# Patient Record
Sex: Male | Born: 1935 | Race: Black or African American | Hispanic: No | Smoking: Former smoker
Health system: Southern US, Community
[De-identification: ages and names within clinical notes are randomized; demographics above are authoritative.]

## PROBLEM LIST (undated history)

## (undated) DIAGNOSIS — N189 Chronic kidney disease, unspecified: Secondary | ICD-10-CM

## (undated) DIAGNOSIS — E785 Hyperlipidemia, unspecified: Secondary | ICD-10-CM

## (undated) DIAGNOSIS — I10 Essential (primary) hypertension: Secondary | ICD-10-CM

## (undated) DIAGNOSIS — J449 Chronic obstructive pulmonary disease, unspecified: Secondary | ICD-10-CM

## (undated) HISTORY — PX: CHOLECYSTECTOMY: SHX55

---

## 2009-05-22 ENCOUNTER — Emergency Department (HOSPITAL_COMMUNITY): Admission: EM | Admit: 2009-05-22 | Discharge: 2009-05-22 | Payer: Self-pay | Admitting: Family Medicine

## 2009-11-29 ENCOUNTER — Inpatient Hospital Stay (HOSPITAL_COMMUNITY): Admission: EM | Admit: 2009-11-29 | Discharge: 2009-12-02 | Payer: Self-pay | Admitting: Emergency Medicine

## 2010-01-09 ENCOUNTER — Inpatient Hospital Stay (HOSPITAL_COMMUNITY): Admission: EM | Admit: 2010-01-09 | Discharge: 2010-01-11 | Payer: Self-pay | Admitting: Emergency Medicine

## 2010-01-17 ENCOUNTER — Observation Stay (HOSPITAL_COMMUNITY): Admission: EM | Admit: 2010-01-17 | Discharge: 2010-01-19 | Payer: Self-pay | Admitting: Emergency Medicine

## 2010-01-18 ENCOUNTER — Encounter: Payer: Self-pay | Admitting: Vascular Surgery

## 2010-01-18 ENCOUNTER — Ambulatory Visit: Payer: Self-pay | Admitting: Vascular Surgery

## 2010-02-20 ENCOUNTER — Ambulatory Visit: Payer: Self-pay | Admitting: Vascular Surgery

## 2010-07-08 IMAGING — CR DG KNEE COMPLETE 4+V*R*
4 series · 4 of 4 positions shown · non-contrast
Comparison: None.

CLINICAL DATA: Fall.

RIGHT KNEE - COMPLETE 4+ VIEW

[t knee ap right]
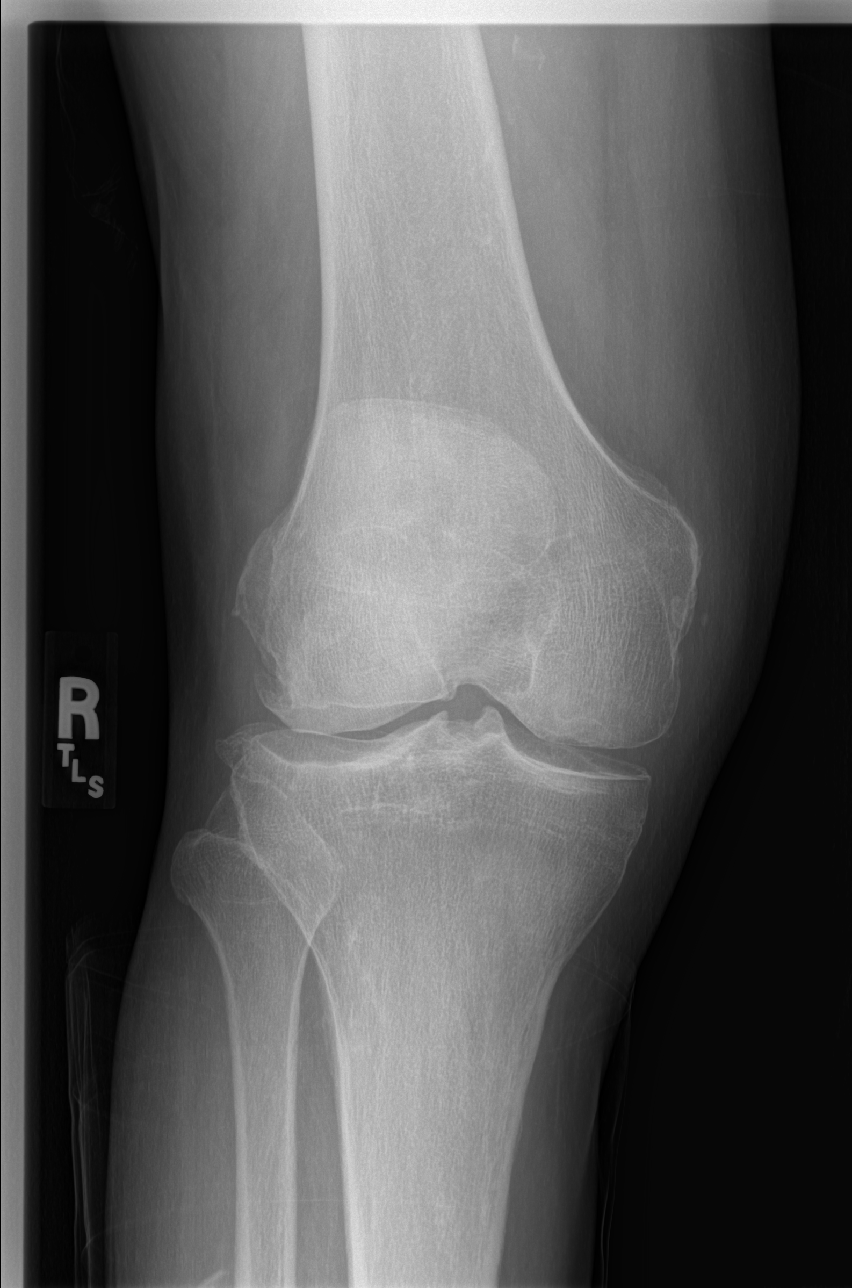

[t knee oblique right (1 of 2)]
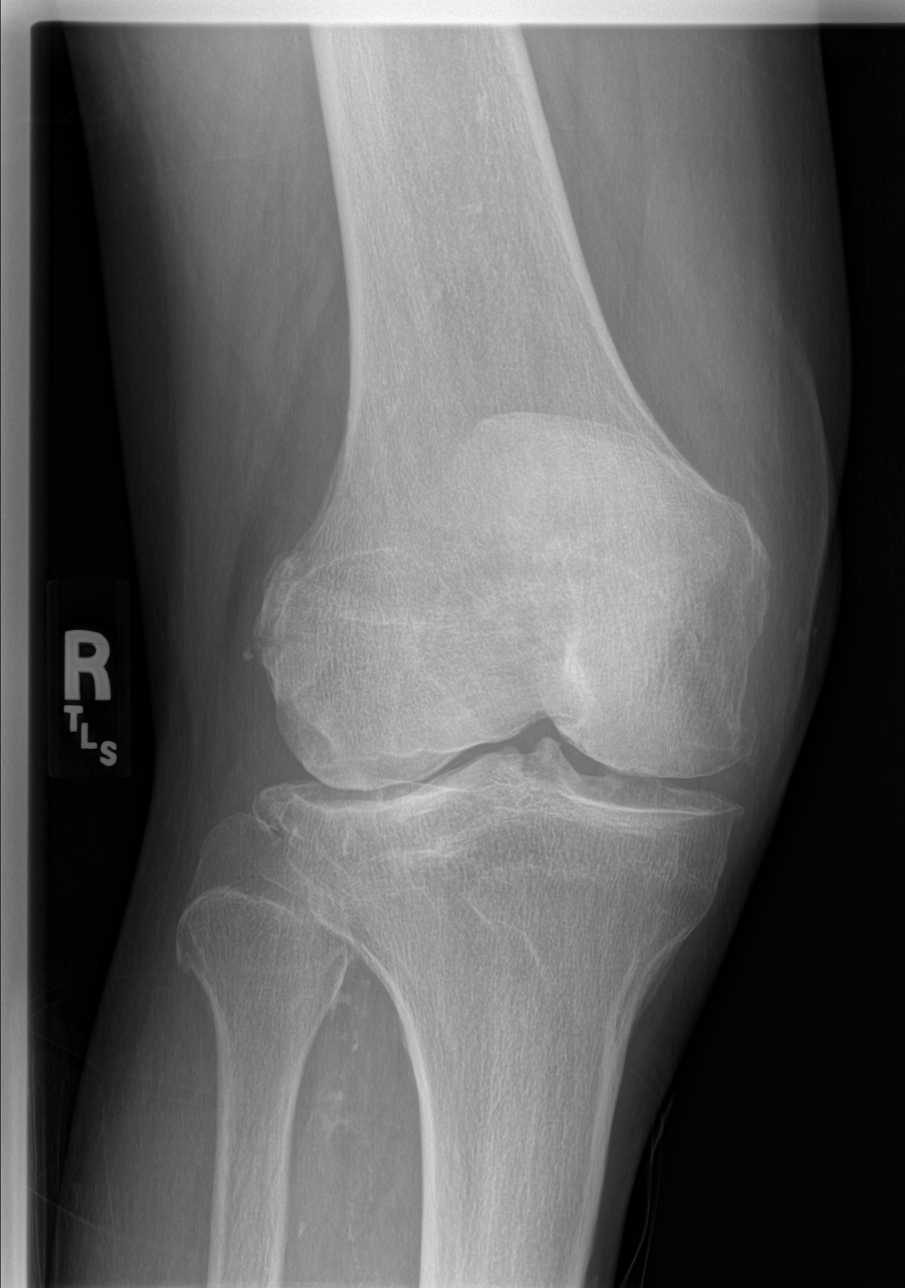

[t knee oblique right (2 of 2)]
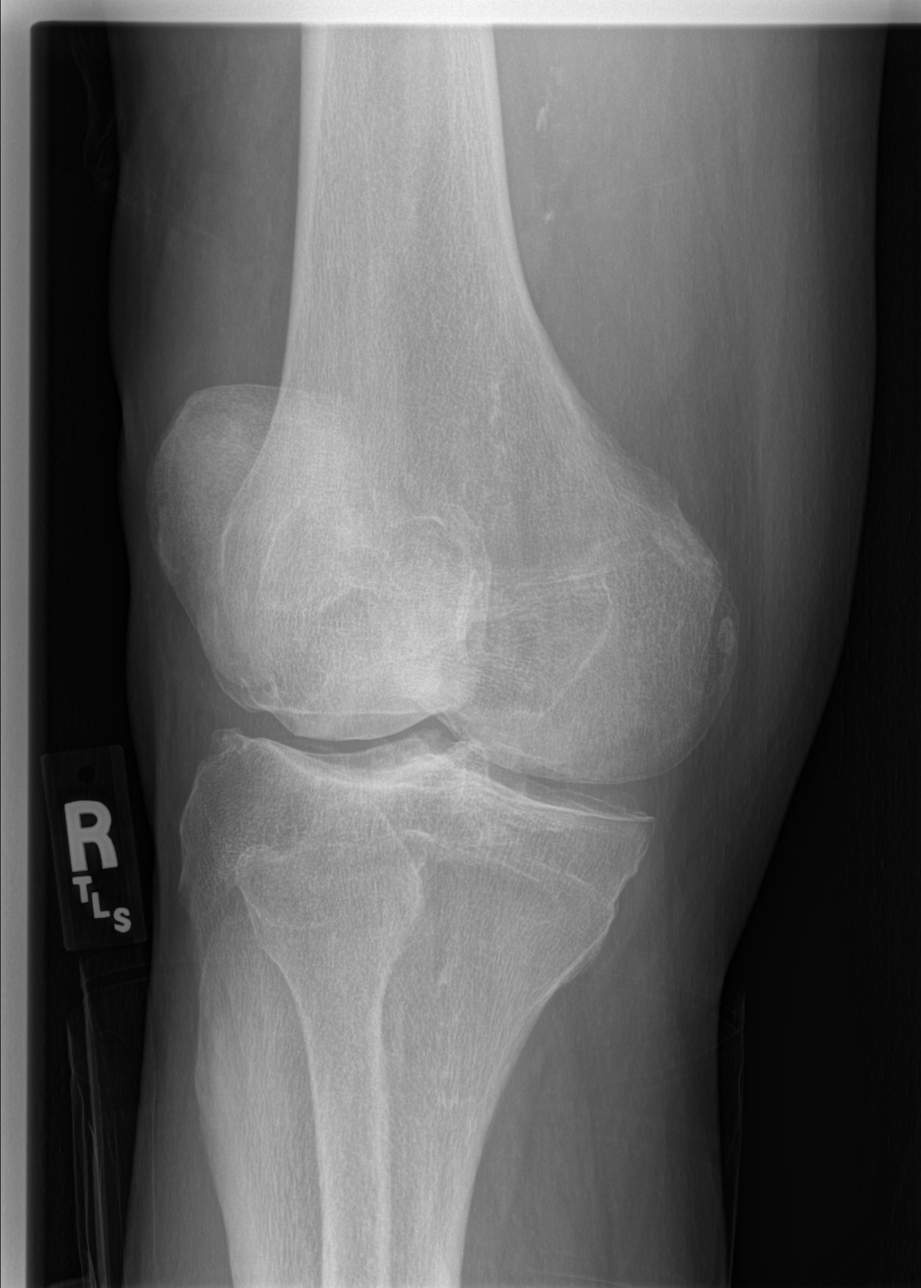

[t knee lat right]
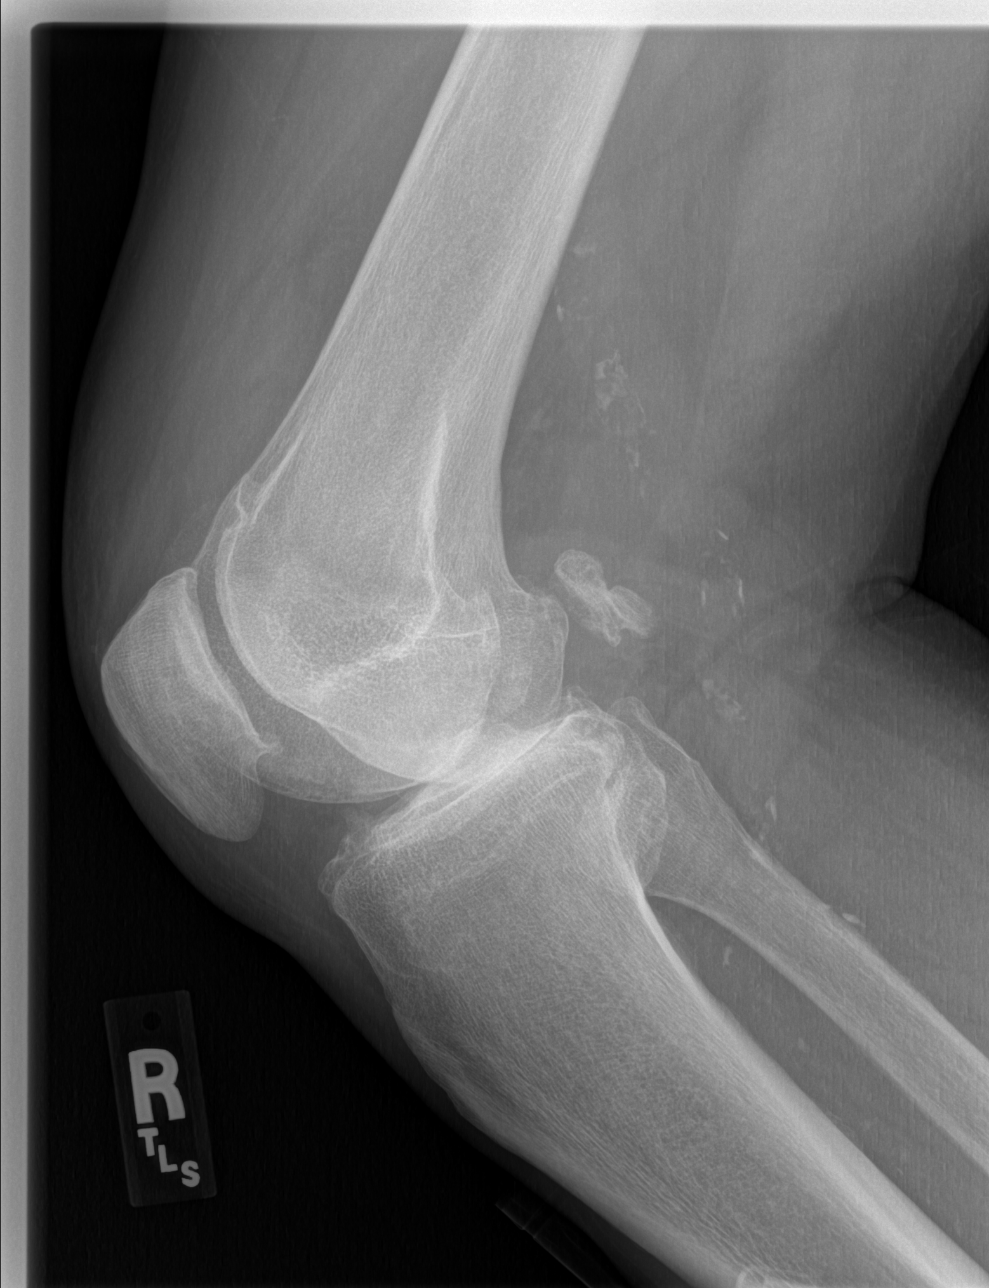

[4 of 4 positions shown; findings below may reference images not displayed]

FINDINGS: Negative for fracture.  There is degenerative change in
all three compartments, most severe laterally where there is joint
space narrowing and spurring.  There is a calcified loose body.  No
definite effusion.  There is popliteal artery calcification.
IMPRESSION: Negative for fracture.

Degenerative changes are most severe laterally.

## 2010-07-08 IMAGING — CR DG KNEE COMPLETE 4+V*L*
4 series · 4 of 4 positions shown · non-contrast
Comparison: None.

CLINICAL DATA: Fall.

LEFT KNEE - COMPLETE 4+ VIEW

[t knee ap left]
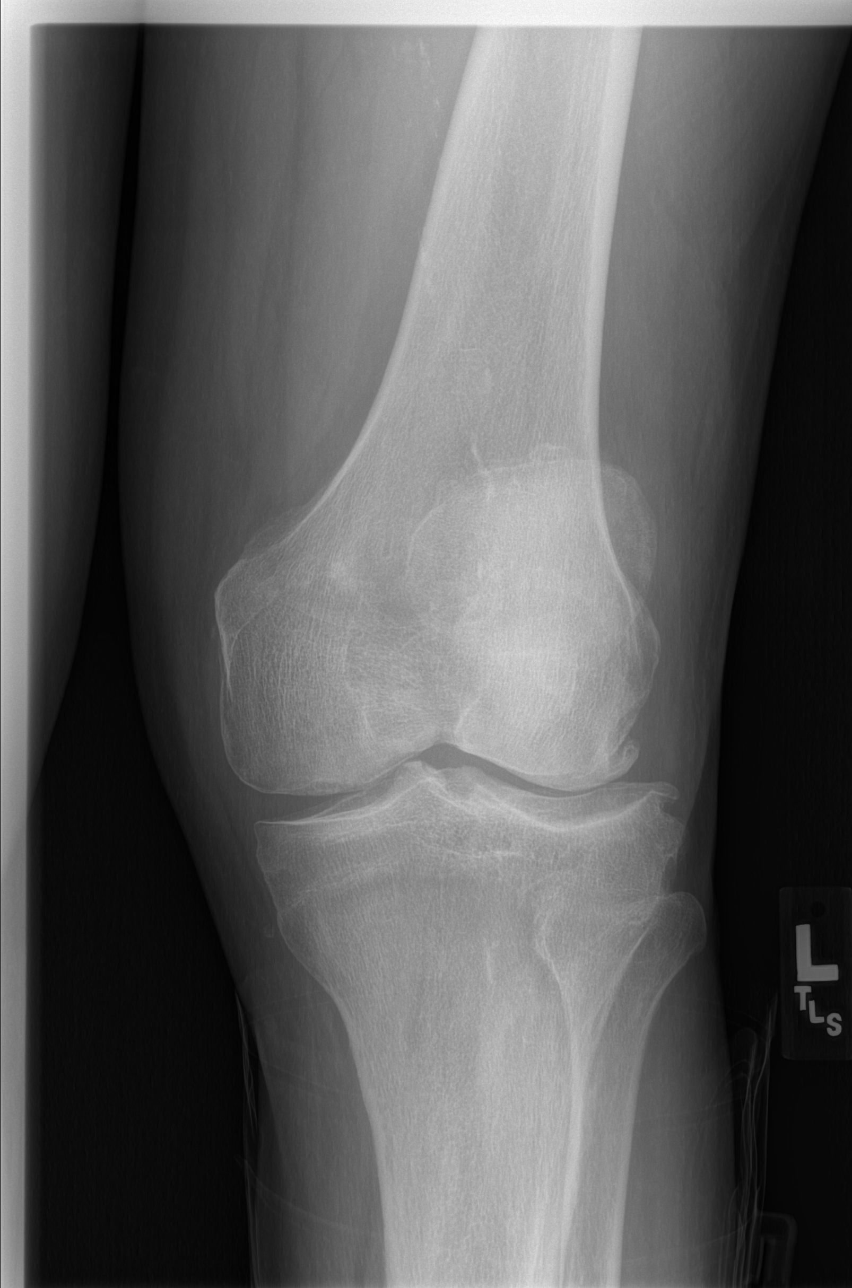

[t knee oblique left (1 of 2)]
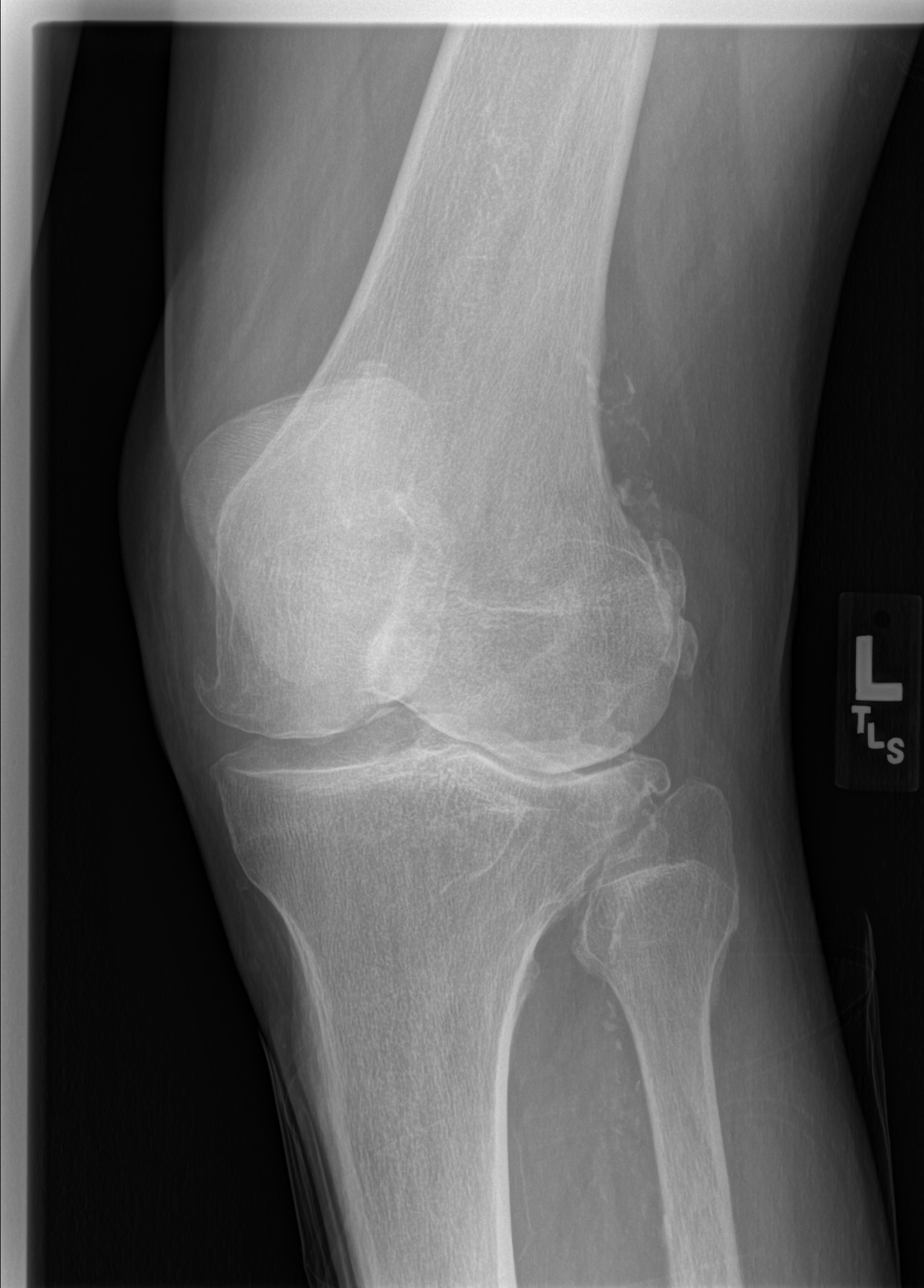

[t knee oblique left (2 of 2)]
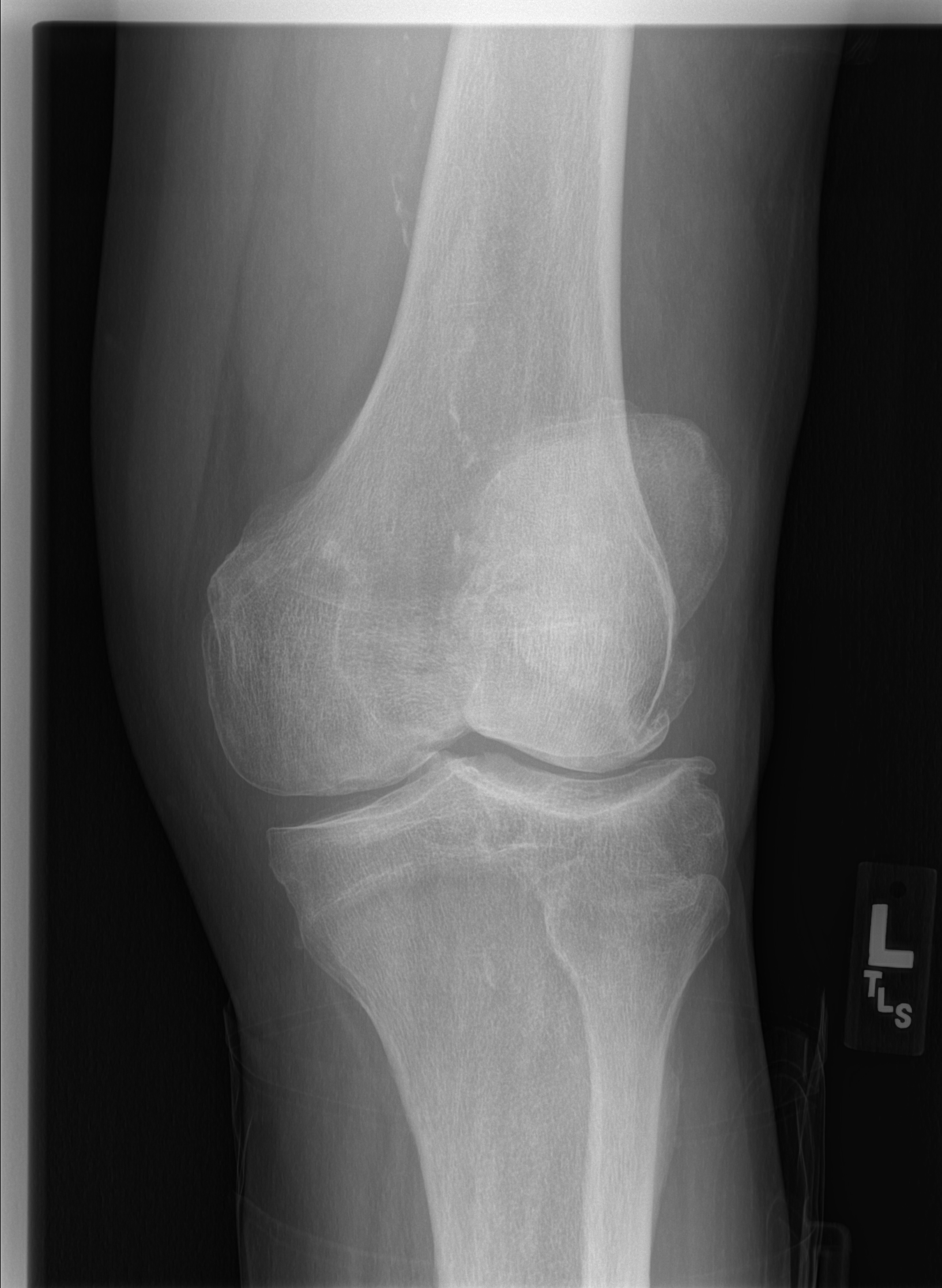

[t knee lat left]
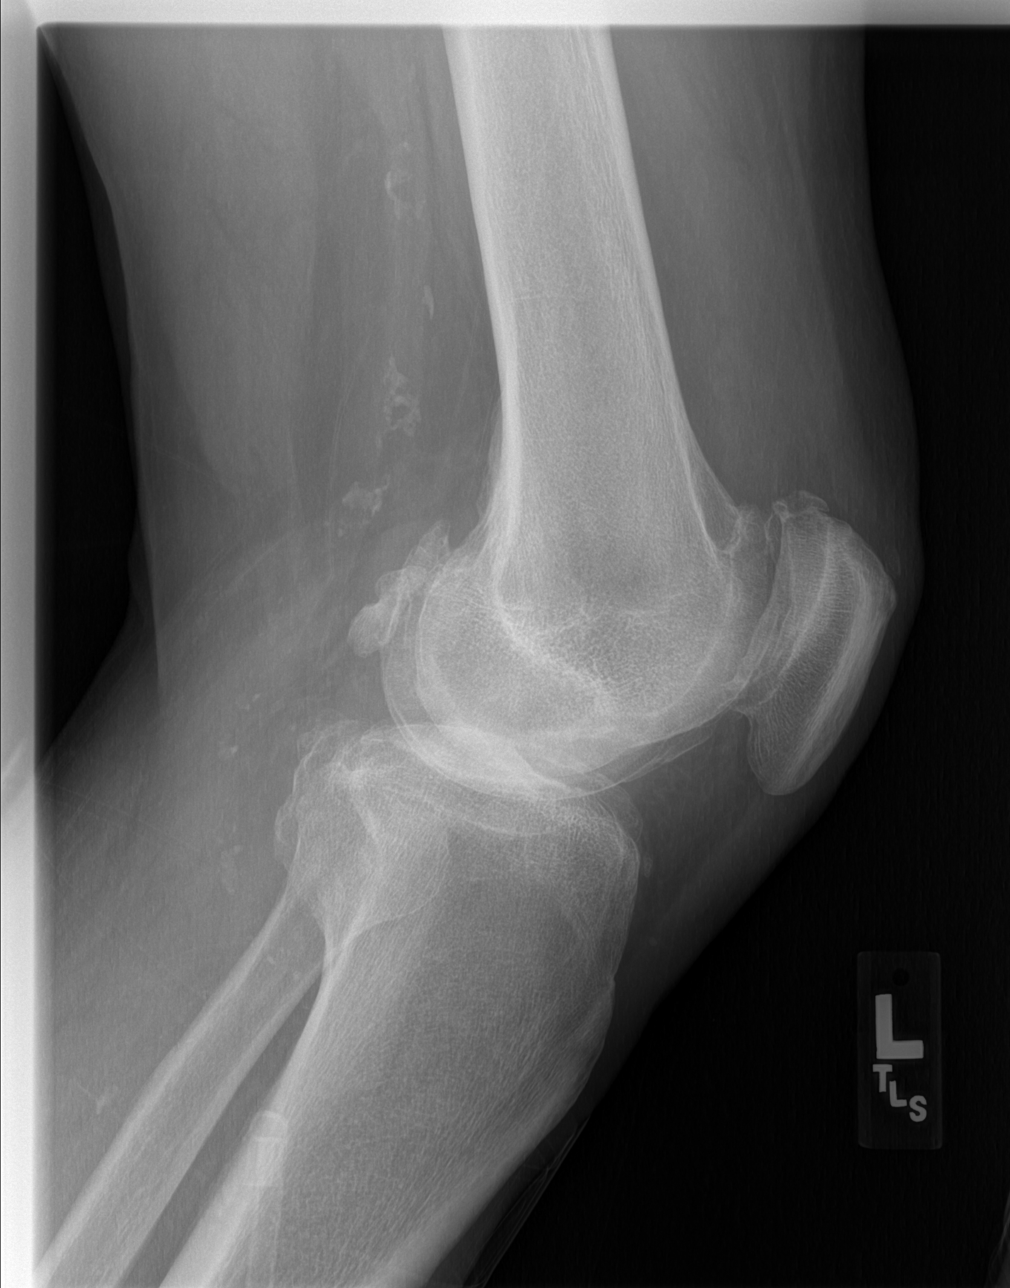

[4 of 4 positions shown; findings below may reference images not displayed]

FINDINGS: Negative for fracture.  There is degenerative change
which is severe in the lateral compartment and the patellofemoral
compartment.  Moderate degenerative change in the medial
compartment.  There is spurring which is most severe in the lateral
compartment.  There is a joint effusion.  There is a calcified
loose body posteriorly.  There is atherosclerotic calcification in
the popliteal artery.
IMPRESSION: Advanced degenerative changes.  Negative for fracture.

## 2010-09-29 ENCOUNTER — Emergency Department (HOSPITAL_COMMUNITY): Admission: EM | Admit: 2010-09-29 | Discharge: 2010-09-29 | Payer: Self-pay | Admitting: Emergency Medicine

## 2011-01-28 LAB — DIFFERENTIAL
Basophils Absolute: 0.1 10*3/uL (ref 0.0–0.1)
Basophils Relative: 1 % (ref 0–1)
Eosinophils Absolute: 0.8 10*3/uL — ABNORMAL HIGH (ref 0.0–0.7)
Eosinophils Relative: 14 % — ABNORMAL HIGH (ref 0–5)
Monocytes Absolute: 0.4 10*3/uL (ref 0.1–1.0)
Monocytes Relative: 8 % (ref 3–12)

## 2011-01-28 LAB — BASIC METABOLIC PANEL
BUN: 16 mg/dL (ref 6–23)
CO2: 29 mEq/L (ref 19–32)
GFR calc non Af Amer: 45 mL/min — ABNORMAL LOW (ref >60)
Glucose, Bld: 122 mg/dL — ABNORMAL HIGH (ref 70–99)
Potassium: 4.1 mEq/L (ref 3.5–5.1)
Sodium: 138 mEq/L (ref 135–145)

## 2011-01-28 LAB — CBC
HCT: 45.9 % (ref 39.0–52.0)
Hemoglobin: 14.9 g/dL (ref 13.0–17.0)
MCH: 29.6 pg (ref 26.0–34.0)
MCHC: 32.5 g/dL (ref 30.0–36.0)
RDW: 13.8 % (ref 11.5–15.5)

## 2011-02-02 LAB — URINALYSIS, ROUTINE W REFLEX MICROSCOPIC
Bilirubin Urine: NEGATIVE
Glucose, UA: 100 mg/dL — AB
Ketones, ur: NEGATIVE mg/dL
Protein, ur: 30 mg/dL — AB
Urobilinogen, UA: 0.2 mg/dL (ref 0.0–1.0)

## 2011-02-02 LAB — BASIC METABOLIC PANEL
BUN: 18 mg/dL (ref 6–23)
BUN: 11 mg/dL (ref 6–23)
CO2: 24 mEq/L (ref 19–32)
Chloride: 106 mEq/L (ref 96–112)
Chloride: 110 mEq/L (ref 96–112)
GFR calc non Af Amer: 38 mL/min — ABNORMAL LOW (ref >60)
GFR calc non Af Amer: 47 mL/min — ABNORMAL LOW (ref >60)
Glucose, Bld: 96 mg/dL (ref 70–99)
Potassium: 4.1 mEq/L (ref 3.5–5.1)
Potassium: 4 mEq/L (ref 3.5–5.1)
Sodium: 138 mEq/L (ref 135–145)
Sodium: 139 mEq/L (ref 135–145)

## 2011-02-02 LAB — DIFFERENTIAL
Basophils Absolute: 0.1 10*3/uL (ref 0.0–0.1)
Basophils Relative: 1 % (ref 0–1)
Eosinophils Absolute: 0.6 10*3/uL (ref 0.0–0.7)
Lymphocytes Relative: 27 % (ref 12–46)
Neutrophils Relative %: 57 % (ref 43–77)

## 2011-02-02 LAB — RENAL FUNCTION PANEL
Calcium: 8.5 mg/dL (ref 8.4–10.5)
Creatinine, Ser: 3.14 mg/dL — ABNORMAL HIGH (ref 0.4–1.5)
GFR calc Af Amer: 23 mL/min — ABNORMAL LOW (ref >60)
Glucose, Bld: 84 mg/dL (ref 70–99)
Phosphorus: 5.3 mg/dL — ABNORMAL HIGH (ref 2.3–4.6)
Sodium: 140 mEq/L (ref 135–145)

## 2011-02-02 LAB — URINE MICROSCOPIC-ADD ON

## 2011-02-02 LAB — CARDIAC PANEL(CRET KIN+CKTOT+MB+TROPI)
CK, MB: 6.4 ng/mL (ref 0.3–4.0)
Relative Index: 1.4 (ref 0.0–2.5)

## 2011-02-02 LAB — COMPREHENSIVE METABOLIC PANEL
Alkaline Phosphatase: 43 U/L (ref 39–117)
BUN: 34 mg/dL — ABNORMAL HIGH (ref 6–23)
Creatinine, Ser: 3.83 mg/dL — ABNORMAL HIGH (ref 0.4–1.5)
Glucose, Bld: 98 mg/dL (ref 70–99)
Potassium: 4.5 mEq/L (ref 3.5–5.1)
Total Bilirubin: 1 mg/dL (ref 0.3–1.2)
Total Protein: 7.5 g/dL (ref 6.0–8.3)

## 2011-02-02 LAB — URINE CULTURE

## 2011-02-02 LAB — CBC
HCT: 36.8 % — ABNORMAL LOW (ref 39.0–52.0)
HCT: 40.7 % (ref 39.0–52.0)
HCT: 35.5 % — ABNORMAL LOW (ref 39.0–52.0)
Hemoglobin: 12.4 g/dL — ABNORMAL LOW (ref 13.0–17.0)
Hemoglobin: 12.6 g/dL — ABNORMAL LOW (ref 13.0–17.0)
Hemoglobin: 13.9 g/dL (ref 13.0–17.0)
Hemoglobin: 12.3 g/dL — ABNORMAL LOW (ref 13.0–17.0)
MCHC: 34 g/dL (ref 30.0–36.0)
MCV: 92.1 fL (ref 78.0–100.0)
MCV: 92.3 fL (ref 78.0–100.0)
MCV: 92.5 fL (ref 78.0–100.0)
MCV: 90.5 fL (ref 78.0–100.0)
Platelets: 147 10*3/uL — ABNORMAL LOW (ref 150–400)
Platelets: 136 10*3/uL — ABNORMAL LOW (ref 150–400)
RBC: 3.98 MIL/uL — ABNORMAL LOW (ref 4.22–5.81)
RDW: 14.5 % (ref 11.5–15.5)
RDW: 14.6 % (ref 11.5–15.5)
RDW: 13.9 % (ref 11.5–15.5)
WBC: 6 10*3/uL (ref 4.0–10.5)

## 2011-02-02 LAB — TROPONIN I: Troponin I: 0.01 ng/mL (ref 0.00–0.06)

## 2011-02-02 LAB — LIPASE, BLOOD: Lipase: 38 U/L (ref 11–59)

## 2011-02-02 LAB — HEMOCCULT GUIAC POC 1CARD (OFFICE)
Fecal Occult Bld: NEGATIVE
Fecal Occult Bld: NEGATIVE

## 2011-02-02 LAB — PHOSPHORUS: Phosphorus: 5.5 mg/dL — ABNORMAL HIGH (ref 2.3–4.6)

## 2011-02-02 LAB — LACTIC ACID, PLASMA: Lactic Acid, Venous: 1.7 mmol/L (ref 0.5–2.2)

## 2011-02-02 LAB — HEMOGLOBIN A1C
Hgb A1c MFr Bld: 7.4 % — ABNORMAL HIGH (ref 4.6–6.1)
Mean Plasma Glucose: 166 mg/dL

## 2011-02-02 LAB — CK TOTAL AND CKMB (NOT AT ARMC): Relative Index: 1.6 (ref 0.0–2.5)

## 2011-02-05 LAB — COMPREHENSIVE METABOLIC PANEL
Albumin: 3.5 g/dL (ref 3.5–5.2)
Albumin: 3.6 g/dL (ref 3.5–5.2)
Alkaline Phosphatase: 50 U/L (ref 39–117)
BUN: 11 mg/dL (ref 6–23)
BUN: 13 mg/dL (ref 6–23)
BUN: 15 mg/dL (ref 6–23)
Calcium: 9.7 mg/dL (ref 8.4–10.5)
Chloride: 102 mEq/L (ref 96–112)
Creatinine, Ser: 1.2 mg/dL (ref 0.4–1.5)
Creatinine, Ser: 1.34 mg/dL (ref 0.4–1.5)
Creatinine, Ser: 1.36 mg/dL (ref 0.4–1.5)
GFR calc Af Amer: >60 mL/min (ref >60)
GFR calc non Af Amer: 58 mL/min — ABNORMAL LOW (ref >60)
Glucose, Bld: 127 mg/dL — ABNORMAL HIGH (ref 70–99)
Glucose, Bld: 86 mg/dL (ref 70–99)
Potassium: 3.8 mEq/L (ref 3.5–5.1)
Potassium: 4 mEq/L (ref 3.5–5.1)
Total Bilirubin: 1 mg/dL (ref 0.3–1.2)
Total Protein: 6.7 g/dL (ref 6.0–8.3)
Total Protein: 7.1 g/dL (ref 6.0–8.3)
Total Protein: 8.2 g/dL (ref 6.0–8.3)

## 2011-02-05 LAB — CBC
HCT: 36.1 % — ABNORMAL LOW (ref 39.0–52.0)
HCT: 37.3 % — ABNORMAL LOW (ref 39.0–52.0)
HCT: 39.8 % (ref 39.0–52.0)
HCT: 41.4 % (ref 39.0–52.0)
Hemoglobin: 14.4 g/dL (ref 13.0–17.0)
MCV: 83.8 fL (ref 78.0–100.0)
MCV: 91.6 fL (ref 78.0–100.0)
MCV: 92 fL (ref 78.0–100.0)
Platelets: 147 10*3/uL — ABNORMAL LOW (ref 150–400)
Platelets: 149 10*3/uL — ABNORMAL LOW (ref 150–400)
Platelets: 150 10*3/uL (ref 150–400)
RBC: 4.94 MIL/uL (ref 4.22–5.81)
RDW: 14.4 % (ref 11.5–15.5)
RDW: 14.4 % (ref 11.5–15.5)
RDW: 14.6 % (ref 11.5–15.5)
WBC: 6 10*3/uL (ref 4.0–10.5)
WBC: 6.2 10*3/uL (ref 4.0–10.5)

## 2011-02-05 LAB — DIFFERENTIAL
Basophils Absolute: 0 10*3/uL (ref 0.0–0.1)
Basophils Absolute: 0.1 10*3/uL (ref 0.0–0.1)
Eosinophils Absolute: 0.6 10*3/uL (ref 0.0–0.7)
Eosinophils Relative: 11 % — ABNORMAL HIGH (ref 0–5)
Lymphocytes Relative: 35 % (ref 12–46)
Lymphocytes Relative: 37 % (ref 12–46)
Lymphocytes Relative: 40 % (ref 12–46)
Lymphs Abs: 2.3 10*3/uL (ref 0.7–4.0)
Monocytes Absolute: 0.5 10*3/uL (ref 0.1–1.0)
Monocytes Absolute: 0.5 10*3/uL (ref 0.1–1.0)
Monocytes Relative: 9 % (ref 3–12)
Neutro Abs: 2.3 10*3/uL (ref 1.7–7.7)
Neutro Abs: 2.5 10*3/uL (ref 1.7–7.7)
Neutrophils Relative %: 44 % (ref 43–77)
Neutrophils Relative %: 44 % (ref 43–77)

## 2011-02-05 LAB — AMYLASE: Amylase: 43 U/L (ref 0–105)

## 2011-02-05 LAB — PROTIME-INR
INR: 1.08 (ref 0.00–1.49)
Prothrombin Time: 13.9 seconds (ref 11.6–15.2)

## 2011-02-05 LAB — BASIC METABOLIC PANEL
BUN: 17 mg/dL (ref 6–23)
Creatinine, Ser: 1.35 mg/dL (ref 0.4–1.5)
GFR calc non Af Amer: 51 mL/min — ABNORMAL LOW (ref >60)
Glucose, Bld: 118 mg/dL — ABNORMAL HIGH (ref 70–99)
Potassium: 4 mEq/L (ref 3.5–5.1)

## 2011-02-05 LAB — LIPASE, BLOOD: Lipase: 34 U/L (ref 11–59)

## 2011-02-05 LAB — HEMOCCULT GUIAC POC 1CARD (OFFICE): Fecal Occult Bld: NEGATIVE

## 2011-02-05 LAB — PHOSPHORUS: Phosphorus: 3.8 mg/dL (ref 2.3–4.6)

## 2011-02-10 LAB — COMPREHENSIVE METABOLIC PANEL
ALT: 14 U/L (ref 0–53)
AST: 21 U/L (ref 0–37)
Albumin: 3.5 g/dL (ref 3.5–5.2)
Albumin: 3.7 g/dL (ref 3.5–5.2)
Alkaline Phosphatase: 57 U/L (ref 39–117)
BUN: 16 mg/dL (ref 6–23)
BUN: 19 mg/dL (ref 6–23)
CO2: 25 mEq/L (ref 19–32)
CO2: 28 mEq/L (ref 19–32)
Chloride: 96 mEq/L (ref 96–112)
Chloride: 99 mEq/L (ref 96–112)
Creatinine, Ser: 1.43 mg/dL (ref 0.4–1.5)
Creatinine, Ser: 1.54 mg/dL — ABNORMAL HIGH (ref 0.4–1.5)
GFR calc non Af Amer: 47 mL/min — ABNORMAL LOW (ref >60)
Glucose, Bld: 133 mg/dL — ABNORMAL HIGH (ref 70–99)
Total Bilirubin: 0.6 mg/dL (ref 0.3–1.2)
Total Protein: 8 g/dL (ref 6.0–8.3)

## 2011-02-10 LAB — CBC
HCT: 38 % — ABNORMAL LOW (ref 39.0–52.0)
HCT: 39 % (ref 39.0–52.0)
Hemoglobin: 13.2 g/dL (ref 13.0–17.0)
MCHC: 33.8 g/dL (ref 30.0–36.0)
MCV: 90.9 fL (ref 78.0–100.0)
MCV: 92 fL (ref 78.0–100.0)
Platelets: 149 10*3/uL — ABNORMAL LOW (ref 150–400)
RBC: 4.28 MIL/uL (ref 4.22–5.81)
WBC: 7.1 10*3/uL (ref 4.0–10.5)
WBC: 7.7 10*3/uL (ref 4.0–10.5)

## 2011-02-10 LAB — URINALYSIS, ROUTINE W REFLEX MICROSCOPIC
Glucose, UA: NEGATIVE mg/dL
Ketones, ur: NEGATIVE mg/dL
Nitrite: NEGATIVE
Protein, ur: NEGATIVE mg/dL
Urobilinogen, UA: 0.2 mg/dL (ref 0.0–1.0)

## 2011-02-10 LAB — DIFFERENTIAL
Basophils Relative: 0 % (ref 0–1)
Eosinophils Absolute: 0.4 10*3/uL (ref 0.0–0.7)
Monocytes Absolute: 0.5 10*3/uL (ref 0.1–1.0)
Monocytes Relative: 6 % (ref 3–12)
Neutro Abs: 5.1 10*3/uL (ref 1.7–7.7)

## 2011-02-10 LAB — LIPID PANEL
Cholesterol: 165 mg/dL (ref 0–200)
LDL Cholesterol: 81 mg/dL (ref 0–99)
Triglycerides: 137 mg/dL (ref <150)

## 2011-02-10 LAB — URINE CULTURE

## 2011-04-01 NOTE — Assessment & Plan Note (Signed)
OFFICE VISIT   Austin Krause, Austin Krause  DOB:  17-Oct-1925                                       02/20/2010  CHART#:[id number]   The patient is an 77 year old male who was recently seen in consultation  in early March for abdominal pain.  At that time he was found to have an  infrarenal abdominal aortic occlusion.  It was discussed with the  primary service at that time that his pain was not secondary to his  aortic occlusion.  He did not really describe any claudication symptoms  at that time.  He was sent for an outpatient evaluation and returns  today for further followup.   He returns today for further follow-up.  He continues to deny any  symptoms of claudication.  He lives in an assisted living, and he states  that he fatigues easily when walking down the hallways but does not  really describe any pain in his calf.  He denies any abdominal pain at  this point.  He has never had any ulcerations or nonhealing wounds on  his feet.  He denies rest pain.  Chronic medical problems include  hypertension, elevated cholesterol and COPD.  These are all followed by  Dr. Concepcion Elk.   REVIEW OF SYSTEMS:  She has occasional wheezing.  He also has multiple  arthritis joint and muscle pain.   PHYSICAL EXAMINATION:  Blood pressure 149/77 in the left arm, heart rate  111 and regular, respirations 24.  HEENT:  Unremarkable.  Neck:  Has 2+  carotid pulses without bruit.  Chest:  Clear to auscultation, other than  a few crackles in the bases.  Cardiac:  Regular rate and rhythm without  murmur.  Abdomen:  Obese, nontender, nondistended.  No masses.  Extremities:  He has 2+ radial pulses bilaterally.  He has absent  femoral, popliteal and pedal pulses bilaterally.  Feet are warm with no  ulcerations.   He had bilateral ABIs performed today which were 0.69 on the right and  0.62 on the left.  CT scan the abdomen and pelvis as mentioned  previously showed infrarenal abdominal aortic  occlusion.   In summary, the patient has an infrarenal aortic occlusion.  He does not  really have claudication symptoms from this.  His ABIs indicate that he  has reasonable perfusion to both lower extremities.  He is not a really  good revascularization candidate due to his overall multiple medical  problems.  Since he currently does not have limb-threatening ischemia, I  believe the best management for this currently is going to be continued  observation with interval ABIs.  He will return in 6 months' time for  repeat ABIs.  He was also counseled today to stop smoking to prevent  worsening of his lower extremity symptoms over time.     Janetta Hora. Fields, MD  Electronically Signed   CEF/MEDQ  D:  02/20/2010  T:  02/21/2010  Job:  [id number]   cc:   Fleet Contras, M.D.

## 2012-01-11 ENCOUNTER — Emergency Department (HOSPITAL_COMMUNITY): Payer: Medicare (Managed Care)

## 2012-01-11 ENCOUNTER — Other Ambulatory Visit: Payer: Self-pay

## 2012-01-11 ENCOUNTER — Inpatient Hospital Stay (HOSPITAL_COMMUNITY)
Admission: EM | Admit: 2012-01-11 | Discharge: 2012-01-16 | DRG: 871 | Disposition: A | Discharge status: Home / Self Care | Payer: Medicare (Managed Care) | Attending: Internal Medicine | Admitting: Internal Medicine

## 2012-01-11 DIAGNOSIS — R7881 Bacteremia: Secondary | ICD-10-CM | POA: Diagnosis present

## 2012-01-11 DIAGNOSIS — J449 Chronic obstructive pulmonary disease, unspecified: Secondary | ICD-10-CM | POA: Diagnosis present

## 2012-01-11 DIAGNOSIS — I959 Hypotension, unspecified: Secondary | ICD-10-CM | POA: Diagnosis present

## 2012-01-11 DIAGNOSIS — N476 Balanoposthitis: Secondary | ICD-10-CM | POA: Diagnosis not present

## 2012-01-11 DIAGNOSIS — I129 Hypertensive chronic kidney disease with stage 1 through stage 4 chronic kidney disease, or unspecified chronic kidney disease: Secondary | ICD-10-CM | POA: Diagnosis present

## 2012-01-11 DIAGNOSIS — R6510 Systemic inflammatory response syndrome (SIRS) of non-infectious origin without acute organ dysfunction: Secondary | ICD-10-CM | POA: Diagnosis present

## 2012-01-11 DIAGNOSIS — R4182 Altered mental status, unspecified: Secondary | ICD-10-CM

## 2012-01-11 DIAGNOSIS — R000 Tachycardia, unspecified: Secondary | ICD-10-CM | POA: Diagnosis present

## 2012-01-11 DIAGNOSIS — I498 Other specified cardiac arrhythmias: Secondary | ICD-10-CM | POA: Diagnosis present

## 2012-01-11 DIAGNOSIS — E785 Hyperlipidemia, unspecified: Secondary | ICD-10-CM | POA: Diagnosis present

## 2012-01-11 DIAGNOSIS — R509 Fever, unspecified: Secondary | ICD-10-CM | POA: Diagnosis present

## 2012-01-11 DIAGNOSIS — R062 Wheezing: Secondary | ICD-10-CM | POA: Diagnosis not present

## 2012-01-11 DIAGNOSIS — A419 Sepsis, unspecified organism: Principal | ICD-10-CM | POA: Diagnosis present

## 2012-01-11 DIAGNOSIS — B9789 Other viral agents as the cause of diseases classified elsewhere: Secondary | ICD-10-CM | POA: Diagnosis present

## 2012-01-11 DIAGNOSIS — G9340 Encephalopathy, unspecified: Secondary | ICD-10-CM | POA: Diagnosis present

## 2012-01-11 DIAGNOSIS — J4489 Other specified chronic obstructive pulmonary disease: Secondary | ICD-10-CM | POA: Diagnosis present

## 2012-01-11 DIAGNOSIS — N189 Chronic kidney disease, unspecified: Secondary | ICD-10-CM | POA: Diagnosis present

## 2012-01-11 HISTORY — DX: Essential (primary) hypertension: I10

## 2012-01-11 HISTORY — DX: Chronic obstructive pulmonary disease, unspecified: J44.9

## 2012-01-11 HISTORY — DX: Hyperlipidemia, unspecified: E78.5

## 2012-01-11 HISTORY — DX: Chronic kidney disease, unspecified: N18.9

## 2012-01-11 LAB — DIFFERENTIAL
Eosinophils Absolute: 0 10*3/uL (ref 0.0–0.7)
Eosinophils Relative: 0 % (ref 0–5)
Lymphocytes Relative: 12 % (ref 12–46)
Lymphs Abs: 1.2 10*3/uL (ref 0.7–4.0)
Monocytes Relative: 5 % (ref 3–12)

## 2012-01-11 LAB — CARDIAC PANEL(CRET KIN+CKTOT+MB+TROPI)
CK, MB: 3.3 ng/mL (ref 0.3–4.0)
Total CK: 392 U/L — ABNORMAL HIGH (ref 7–232)
Troponin I: <0.3 ng/mL (ref <0.30)

## 2012-01-11 LAB — CBC
Hemoglobin: 12.2 g/dL — ABNORMAL LOW (ref 13.0–17.0)
MCH: 29.5 pg (ref 26.0–34.0)
MCV: 88.6 fL (ref 78.0–100.0)
Platelets: 123 10*3/uL — ABNORMAL LOW (ref 150–400)
RBC: 4.14 MIL/uL — ABNORMAL LOW (ref 4.22–5.81)
WBC: 10.2 10*3/uL (ref 4.0–10.5)

## 2012-01-11 LAB — COMPREHENSIVE METABOLIC PANEL
ALT: 8 U/L (ref 0–53)
Alkaline Phosphatase: 76 U/L (ref 39–117)
BUN: 23 mg/dL (ref 6–23)
CO2: 24 mEq/L (ref 19–32)
Calcium: 8.3 mg/dL — ABNORMAL LOW (ref 8.4–10.5)
GFR calc Af Amer: 38 mL/min — ABNORMAL LOW (ref >90)
GFR calc non Af Amer: 33 mL/min — ABNORMAL LOW (ref >90)
Glucose, Bld: 123 mg/dL — ABNORMAL HIGH (ref 70–99)
Potassium: 3.4 mEq/L — ABNORMAL LOW (ref 3.5–5.1)
Sodium: 137 mEq/L (ref 135–145)

## 2012-01-11 LAB — URINALYSIS, ROUTINE W REFLEX MICROSCOPIC
Bilirubin Urine: NEGATIVE
Glucose, UA: NEGATIVE mg/dL
Hgb urine dipstick: NEGATIVE
Protein, ur: NEGATIVE mg/dL
Urobilinogen, UA: 1 mg/dL (ref 0.0–1.0)

## 2012-01-11 LAB — LACTIC ACID, PLASMA: Lactic Acid, Venous: 2.2 mmol/L (ref 0.5–2.2)

## 2012-01-11 MED ORDER — SODIUM CHLORIDE 0.9 % IV SOLN
1000.0000 mL | Freq: Once | INTRAVENOUS | Status: AC
  Administered 2012-01-11: 1000 mL via INTRAVENOUS

## 2012-01-11 MED ORDER — PIPERACILLIN-TAZOBACTAM 3.375 G IVPB
3.3750 g | Freq: Once | INTRAVENOUS | Status: AC
  Administered 2012-01-11: 3.375 g via INTRAVENOUS
  Filled 2012-01-11: qty 50

## 2012-01-11 MED ORDER — ALBUTEROL SULFATE (5 MG/ML) 0.5% IN NEBU
INHALATION_SOLUTION | RESPIRATORY_TRACT | Status: AC
  Administered 2012-01-11: 20:00:00
  Filled 2012-01-11: qty 1

## 2012-01-11 MED ORDER — ACETAMINOPHEN 325 MG PO TABS
ORAL_TABLET | ORAL | Status: AC
  Administered 2012-01-11: 325 mg via ORAL
  Filled 2012-01-11: qty 1

## 2012-01-11 MED ORDER — ACETAMINOPHEN 325 MG PO TABS
650.0000 mg | ORAL_TABLET | Freq: Once | ORAL | Status: AC
  Administered 2012-01-11: 650 mg via ORAL
  Filled 2012-01-11: qty 2

## 2012-01-11 MED ORDER — ACETAMINOPHEN 325 MG PO TABS
325.0000 mg | ORAL_TABLET | Freq: Once | ORAL | Status: AC
  Administered 2012-01-11: 325 mg via ORAL

## 2012-01-11 MED ORDER — SODIUM CHLORIDE 0.9 % IV SOLN
1000.0000 mL | INTRAVENOUS | Status: DC
  Administered 2012-01-11 (×5): 1000 mL via INTRAVENOUS

## 2012-01-11 MED ORDER — VANCOMYCIN HCL IN DEXTROSE 1-5 GM/200ML-% IV SOLN
1000.0000 mg | Freq: Once | INTRAVENOUS | Status: AC
  Administered 2012-01-11: 1000 mg via INTRAVENOUS
  Filled 2012-01-11: qty 200

## 2012-01-11 NOTE — ED Notes (Signed)
EDP Hunt verified low BP 3rd bolus infusing- Hayden RN present assisting with this pt care.

## 2012-01-11 NOTE — ED Notes (Signed)
Called lab about delay- spoke with Marcelle Smiling states she will credit pt triponin and run cardiac panel

## 2012-01-11 NOTE — ED Provider Notes (Signed)
History     CSN: [id number]  Arrival date & time 01/11/12  [id number]   First MD Initiated Contact with Patient 01/11/12 1835      Chief Complaint  Patient presents with  . Weakness  . Shortness of Breath  . Wheezing    (Consider location/radiation/quality/duration/timing/severity/associated sxs/prior treatment) HPI Patient is an 78 year old male who presents today by EMS with very limited history. He is febrile to 102.5 upon arrival with heart rate in the 130s and an oxygen saturation of 88% on room air. Patient was not hypotensive. No family was available upon arrival. Patient is reportedly not a diabetic. He was oriented x4 and alert. There is no obvious focal deficit neurologically. Patient denied any focal pain but reported pain all over. He was a very poor historian otherwise. EMS reported that the patient did complain of having cough and congestion prior to arrival. No other information was able to be obtained at this time. No past medical history on file.  No past surgical history on file.  No family history on file.  History  Substance Use Topics  . Smoking status: Not on file  . Smokeless tobacco: Not on file  . Alcohol Use: Not on file      Review of Systems  Unable to perform ROS: Other    Allergies  Review of patient's allergies indicates no known allergies.  Home Medications  No current outpatient prescriptions on file.  BP 109/54  Pulse 97  Temp(Src) 100.3 F (37.9 C) (Oral)  Resp 21  SpO2 97%  Physical Exam  Nursing note and vitals reviewed. GEN: Well-developed, well-nourished male in significant distress HEENT: Atraumatic, normocephalic. Oropharynx clear without erythema EYES: PERRLA BL, no scleral icterus. NECK: Trachea midline, no meningismus CV: Tachycardia with regular rhythm. No murmurs, rubs, or gallops. No peripheral edema PULM: No respiratory distress.  No crackles, wheezes, or rales. GI: soft, non-tender. No guarding, rebound, or  tenderness. + bowel sounds  Neuro: cranial nerves 2-12 intact, no abnormalities of strength or sensation, A and O x 3. Patient is difficult to elicit history from. There is no family available to discern if this is the patient's baseline level of functioning MSK: Patient moves all 4 extremities symmetrically, no deformity, edema, or injury noted Psych: no abnormality of mood   ED Course  Procedures (including critical care time)  CRITICAL CARE Performed by: Cyndra Numbers   Total critical care time: 1 hour  Critical care time was exclusive of separately billable procedures and treating other patients.  Critical care was necessary to treat or prevent imminent or life-threatening deterioration.  Critical care was time spent personally by me on the following activities: development of treatment plan with patient and/or surrogate as well as nursing, discussions with consultants, evaluation of patient's response to treatment, examination of patient, obtaining history from patient or surrogate, ordering and performing treatments and interventions, ordering and review of laboratory studies, ordering and review of radiographic studies, pulse oximetry and re-evaluation of patient's condition.   Date: 01/12/2012  Rate: 134  Rhythm: sinus tachycardia and premature atrial contractions (PAC)  QRS Axis: normal  Intervals: normal  ST/T Wave abnormalities: normal  Conduction Disutrbances:left anterior fascicular block  Narrative Interpretation:   Old EKG Reviewed: none available   Labs Reviewed  CBC - Abnormal; Notable for the following:    RBC 4.14 (*)    Hemoglobin 12.2 (*)    HCT 36.7 (*)    Platelets 123 (*)    All other components within normal  limits  DIFFERENTIAL - Abnormal; Notable for the following:    Neutrophils Relative 83 (*)    Neutro Abs 8.5 (*)    All other components within normal limits  COMPREHENSIVE METABOLIC PANEL - Abnormal; Notable for the following:    Potassium 3.4  (*)    Glucose, Bld 123 (*)    Creatinine, Ser 1.80 (*)    Calcium 8.3 (*)    Albumin 3.1 (*)    GFR calc non Af Amer 33 (*)    GFR calc Af Amer 38 (*)    All other components within normal limits  URINALYSIS, ROUTINE W REFLEX MICROSCOPIC - Abnormal; Notable for the following:    Color, Urine AMBER (*) BIOCHEMICALS MAY BE AFFECTED BY COLOR   Ketones, ur TRACE (*)    All other components within normal limits  LACTIC ACID, PLASMA  PROCALCITONIN  CULTURE, BLOOD (ROUTINE X 2)  CULTURE, BLOOD (ROUTINE X 2)  URINE CULTURE  TROPONIN I  CARDIAC PANEL(CRET KIN+CKTOT+MB+TROPI)   Dg Chest 1 View  01/11/2012  *RADIOLOGY REPORT*  Clinical Data: Cough and shortness of breath.  CHEST - 1 VIEW  Comparison: 09/29/2010  Findings: Lungs are clear bilaterally.  Heart size is within normal limits.  Trachea is midline.  The bony structures appear stable.  IMPRESSION: No acute chest findings.  Original Report Authenticated By: Richarda Overlie, M.D.   Ct Head Wo Contrast  01/11/2012  *RADIOLOGY REPORT*  Clinical Data: Cough.  Fever. Weakness.  CT HEAD WITHOUT CONTRAST  Technique:  Contiguous axial images were obtained from the base of the skull through the vertex without contrast.  Comparison: None.  Findings: The brain stem, cerebellum, cerebral peduncles, thalami, basal ganglia, basilar cisterns, and ventricular system appear unremarkable.  Periventricular and corona radiata white matter hypodensities are most compatible with chronic ischemic microvascular white matter disease.  Focal hypodensity medially in the left periventricular white matter is most compatible with a remote lacunar infarct.  No intracranial hemorrhage, mass lesion, or acute infarction is identified.  There is acute-on-chronic bilateral maxillary sinusitis along with opacification of the majority of the ethmoid air cells.  Mucosal thickening in the nasal cavity is present along with acute sphenoid sinusitis.  Bilateral frontal sinusitis is present.   IMPRESSION:  1. Periventricular and corona radiata white matter hypodensities are most compatible with chronic ischemic microvascular white matter disease. 2.  Focal hypodensity in the left periventricular white matter likely represents remote lacunar infarct. 3.  Acute on chronic paranasal sinusitis.  Original Report Authenticated By: Dellia Cloud, M.D.     1. SIRS (systemic inflammatory response syndrome)   2. Altered mental status       MDM  Patient was evaluated by myself. Based on his presentation he immediately had workup for sepsis ordered. Patient was given Tylenol as well as IV fluids. Neither chest x-ray nor urinalysis revealed definite source for the patient's symptoms. He was given broad-spectrum antibiotics with vancomycin and Zosyn. Given patient's presentation and possible all terminal status a CT head was performed which showed no acute changes concerning for CVA. Patient had elevated BUN and creatinine ratio with a negative troponin. Patient had a relative leukocytosis with a white count of 10.2 elevated from recent values of 4-5. Patient was given vancomycin and Zosyn. I was able to contact his daughter, Mrs. Kelvin Cellar. She also is able to provide only limited history. The patient is apparently a member of the [facility]. I spoke with the on-call nurse for this. She is  aware of the patient's admission. With 3 L of normal saline IV bolus as well as a total of 975 mg of Tylenol and antibiotics the patient's HEENT exam is improved. Heart rate decreased to 90s, blood pressure was able to be maintained to 100 systolic. It had been as low as the 70s. Patient's sensorium remained unchanged. He was oriented throughout. His oxygen levels remained stable. Patient will be admitted to the [facility] for further management. Throughout his time here I reviewed his chest x-ray and head CT myself. I also performed numerous reassessment on the patient was resuscitation was being  performed.       Cyndra Numbers, MD 01/12/12 [phone]

## 2012-01-11 NOTE — ED Notes (Signed)
Pt presents via EMS with complaints of cough, fever, and not feeling well, c/o generalized body pain, congested cough noted, saline lock started by EMS prior to arrival in the left [facility], site without redness or swelling. Continuous cardiac monitor applied showing sinus tachycardia, no ectopy Continuous pulse oximetry applied. Automatic BP cuff applied.

## 2012-01-11 NOTE — ED Notes (Signed)
Per [facility]_ pt here to visit family did not bring meds- Pt resides in [place].  Family reports pt has not been feeling right all weekend.  5mg  albuterol given in route- denies CP presents with shortness of breath and wheezing

## 2012-01-12 ENCOUNTER — Other Ambulatory Visit: Payer: Self-pay

## 2012-01-12 ENCOUNTER — Encounter (HOSPITAL_COMMUNITY): Payer: Self-pay | Admitting: Internal Medicine

## 2012-01-12 DIAGNOSIS — E785 Hyperlipidemia, unspecified: Secondary | ICD-10-CM | POA: Insufficient documentation

## 2012-01-12 DIAGNOSIS — N189 Chronic kidney disease, unspecified: Secondary | ICD-10-CM | POA: Diagnosis present

## 2012-01-12 DIAGNOSIS — J449 Chronic obstructive pulmonary disease, unspecified: Secondary | ICD-10-CM | POA: Diagnosis present

## 2012-01-12 DIAGNOSIS — I10 Essential (primary) hypertension: Secondary | ICD-10-CM | POA: Insufficient documentation

## 2012-01-12 LAB — CBC
Hemoglobin: 12.2 g/dL — ABNORMAL LOW (ref 13.0–17.0)
MCH: 29.2 pg (ref 26.0–34.0)
MCHC: 33 g/dL (ref 30.0–36.0)
RDW: 14.3 % (ref 11.5–15.5)

## 2012-01-12 LAB — BASIC METABOLIC PANEL
BUN: 20 mg/dL (ref 6–23)
Calcium: 7.9 mg/dL — ABNORMAL LOW (ref 8.4–10.5)
Creatinine, Ser: 1.64 mg/dL — ABNORMAL HIGH (ref 0.50–1.35)
GFR calc non Af Amer: 37 mL/min — ABNORMAL LOW (ref >90)
Glucose, Bld: 87 mg/dL (ref 70–99)
Potassium: 4.7 mEq/L (ref 3.5–5.1)

## 2012-01-12 MED ORDER — VITAMINS A & D EX OINT
TOPICAL_OINTMENT | CUTANEOUS | Status: AC
  Administered 2012-01-12: 5
  Filled 2012-01-12: qty 5

## 2012-01-12 MED ORDER — METHYLPREDNISOLONE SODIUM SUCC 125 MG IJ SOLR
60.0000 mg | INTRAMUSCULAR | Status: DC
  Administered 2012-01-12 – 2012-01-13 (×10): 60 mg via INTRAVENOUS
  Filled 2012-01-12 (×7): qty 0.96
  Filled 2012-01-12: qty 2
  Filled 2012-01-12 (×6): qty 0.96
  Filled 2012-01-12: qty 2
  Filled 2012-01-12 (×3): qty 0.96

## 2012-01-12 MED ORDER — ACETAMINOPHEN 325 MG PO TABS
650.0000 mg | ORAL_TABLET | Freq: Four times a day (QID) | ORAL | Status: DC | PRN
  Filled 2012-01-12: qty 2

## 2012-01-12 MED ORDER — VANCOMYCIN HCL 1000 MG IV SOLR
1250.0000 mg | INTRAVENOUS | Status: DC
  Administered 2012-01-12 – 2012-01-13 (×2): 1250 mg via INTRAVENOUS
  Filled 2012-01-12 (×3): qty 1250

## 2012-01-12 MED ORDER — BIOTENE DRY MOUTH MT LIQD
15.0000 mL | Freq: Two times a day (BID) | OROMUCOSAL | Status: DC
  Administered 2012-01-12 – 2012-01-16 (×7): 15 mL via OROMUCOSAL

## 2012-01-12 MED ORDER — ALBUTEROL SULFATE (5 MG/ML) 0.5% IN NEBU
2.5000 mg | INHALATION_SOLUTION | RESPIRATORY_TRACT | Status: DC | PRN
  Administered 2012-01-12 – 2012-01-15 (×4): 2.5 mg via RESPIRATORY_TRACT
  Filled 2012-01-12 (×4): qty 0.5

## 2012-01-12 MED ORDER — OXYCODONE HCL 5 MG PO TABS
5.0000 mg | ORAL_TABLET | ORAL | Status: DC | PRN
  Administered 2012-01-13: 5 mg via ORAL
  Filled 2012-01-12: qty 1

## 2012-01-12 MED ORDER — IPRATROPIUM BROMIDE 0.02 % IN SOLN
0.5000 mg | Freq: Four times a day (QID) | RESPIRATORY_TRACT | Status: DC | PRN
  Administered 2012-01-13 – 2012-01-14 (×2): 0.5 mg via RESPIRATORY_TRACT
  Filled 2012-01-12 (×2): qty 2.5

## 2012-01-12 MED ORDER — HYDROMORPHONE HCL PF 1 MG/ML IJ SOLN
0.5000 mg | INTRAMUSCULAR | Status: DC | PRN
  Administered 2012-01-15: 1 mg via INTRAVENOUS
  Filled 2012-01-12: qty 1

## 2012-01-12 MED ORDER — ACETAMINOPHEN 650 MG RE SUPP: 650.0000 mg | Freq: Four times a day (QID) | RECTAL | Status: DC | PRN

## 2012-01-12 MED ORDER — ZOLPIDEM TARTRATE 5 MG PO TABS: 5.0000 mg | ORAL_TABLET | Freq: Every evening | ORAL | Status: DC | PRN

## 2012-01-12 MED ORDER — ALUM & MAG HYDROXIDE-SIMETH 200-200-20 MG/5ML PO SUSP: 30.0000 mL | Freq: Four times a day (QID) | ORAL | Status: DC | PRN

## 2012-01-12 MED ORDER — ONDANSETRON HCL 4 MG/2ML IJ SOLN: 4.0000 mg | Freq: Four times a day (QID) | INTRAMUSCULAR | Status: DC | PRN

## 2012-01-12 MED ORDER — ENOXAPARIN SODIUM 40 MG/0.4ML ~~LOC~~ SOLN
40.0000 mg | SUBCUTANEOUS | Status: DC
  Administered 2012-01-12 – 2012-01-16 (×5): 40 mg via SUBCUTANEOUS
  Filled 2012-01-12 (×5): qty 0.4

## 2012-01-12 MED ORDER — ONDANSETRON HCL 4 MG PO TABS: 4.0000 mg | ORAL_TABLET | Freq: Four times a day (QID) | ORAL | Status: DC | PRN

## 2012-01-12 MED ORDER — SODIUM CHLORIDE 0.9 % IV SOLN
INTRAVENOUS | Status: DC
  Administered 2012-01-12 – 2012-01-15 (×3): via INTRAVENOUS

## 2012-01-12 MED ORDER — PIPERACILLIN-TAZOBACTAM 3.375 G IVPB
3.3750 g | Freq: Three times a day (TID) | INTRAVENOUS | Status: DC
  Administered 2012-01-12 – 2012-01-15 (×10): 3.375 g via INTRAVENOUS
  Filled 2012-01-12 (×13): qty 50

## 2012-01-12 NOTE — Progress Notes (Signed)
ANTIBIOTIC CONSULT NOTE - INITIAL  Pharmacy Consult for Vancomycin and Zosyn  Indication: SIRS, fever  No Known Allergies  Patient Measurements: Height: 6' (182.9 cm) Weight: 223 lb 1.7 oz (101.2 kg) IBW/kg (Calculated) : 77.6  Adjusted Body Weight:   Vital Signs: Temp: 98.6 F (37 C) (02/25 0105) Temp src: Oral (02/24 1956) BP: 113/66 mmHg (02/25 0054) Pulse Rate: 85  (02/25 0105) Intake/Output from previous day: 02/24 0701 - 02/25 0700 In: 3075 [I.V.:3075] Out: 725 [Urine:725] Intake/Output from this shift: Total I/O In: 3075 [I.V.:3075] Out: 725 [Urine:725]  Labs:  [facility] 01/11/12 1955  WBC 10.2  HGB 12.2*  PLT 123*  LABCREA --  CREATININE 1.80*   Estimated Creatinine Clearance: 37.6 ml/min (by C-G formula based on Cr of 1.8). No results found for this basename: VANCOTROUGH:2,VANCOPEAK:2,VANCORANDOM:2,GENTTROUGH:2,GENTPEAK:2,GENTRANDOM:2,TOBRATROUGH:2,TOBRAPEAK:2,TOBRARND:2,AMIKACINPEAK:2,AMIKACINTROU:2,AMIKACIN:2, in the last 72 hours   Microbiology: No results found for this or any previous visit (from the past 720 hour(s)).  Medical History: Past Medical History  Diagnosis Date  . COPD (chronic obstructive pulmonary disease)   . HTN (hypertension)   . Hyperlipidemia   . CKD (chronic kidney disease)     Medications:  Anti-infectives     Start     Dose/Rate Route Frequency Ordered Stop   01/12/12 1500   vancomycin (VANCOCIN) 1,250 mg in sodium chloride 0.9 % 250 mL IVPB        1,250 mg 166.7 mL/hr over 90 Minutes Intravenous Every 24 hours 01/12/12 0250     01/12/12 0600   piperacillin-tazobactam (ZOSYN) IVPB 3.375 g        3.375 g 12.5 mL/hr over 240 Minutes Intravenous 3 times per day 01/12/12 0222     01/11/12 2015   vancomycin (VANCOCIN) IVPB 1000 mg/200 mL premix        1,000 mg 200 mL/hr over 60 Minutes Intravenous  Once 01/11/12 2010 01/11/12 2135   01/11/12 2015   piperacillin-tazobactam (ZOSYN) IVPB 3.375 g        3.375 g 100 mL/hr  over 30 Minutes Intravenous Once 01/11/12 2010 01/11/12 2259         Assessment: Patient with SIRS, fever.  First dose of antibiotics already given in ED.  Patient with poor renal function  Goal of Therapy:  Vancomycin trough level 15-20 mcg/ml Zosyn based on renal function   Plan:  Measure antibiotic drug levels at steady state Follow up culture results Vancomycin 1250mg  iv q24hr Zosyn 3.375g IV Q8H infused over 4hrs.   Darlina Guys, Jacquenette Shone Crowford 01/12/2012,2:51 AM

## 2012-01-12 NOTE — Progress Notes (Signed)
CRITICAL VALUE ALERT  Critical value received:  Blood Cx (+)  Date of notification:  01/12/12  Time of notification:  2229  Critical value read back:yes  Nurse who received alert:  Bethann Humble RN  MD notified (1st page): Mitchel Honour  Time of first page:  2232  MD notified (2nd page):  Time of second page:  Responding MD:  Mitchel Honour  Time MD responded:  2234

## 2012-01-12 NOTE — H&P (Signed)
DATE OF ADMISSION:  01/11/2012  PCP:   No primary provider on file.   Chief Complaint: Confusion, Fever   HPI: Austin Krause is an 78 y.o. male who was brought to the Ed secondary to increased confusion and fever to 102 today.  He has had myalgias.  HIs daughter was at the bedside at helps give the history, he has not had any cough or nausea or vomiting or diarrhea. IN the ED he was found to have fever and tachycardia and hypotension.  Blood cultures were sent and a Lactate level was done and found to be elevated at 2.2.  He was also found to have a WBC count of 10.2.  He was placed on IV antibiotic therapy of Vancomycin and Zosyn and referred for medical admission.       Past Medical History  Diagnosis Date  . COPD (chronic obstructive pulmonary disease)   . HTN (hypertension)   . Hyperlipidemia   . CKD (chronic kidney disease)     Past Surgical History  Procedure Date  . Cholecystectomy     Medications:  HOME MEDS: Prior to Admission medications   Not on File    Allergies:  No Known Allergies  Social History:   does not have a smoking history on file. He does not have any smokeless tobacco history on file. His alcohol and drug histories not on file.  Family History: No family history on file.  Review of Systems: Other than the history obtained above from the daughter, Unable to obtain from the patient.     Physical Exam:  GEN: Somnulent but arousable 78 year old elderly well developed African American male examined  and in no acute distress Filed Vitals:   01/11/12 2300 01/11/12 2315 01/11/12 2330 01/11/12 2345  BP: 109/60 105/54 102/57 113/53  Pulse: 89 91 91 90  Temp: 99.8 F (37.7 C) 99.5 F (37.5 C) 99.3 F (37.4 C) 99.2 F (37.3 C)  TempSrc:      Resp: 19 18 18 19   SpO2: 97% 97% 96% 96%   Blood pressure 113/53, pulse 90, temperature 99.2 F (37.3 C), temperature source Oral, resp. rate 19, SpO2 96.00%. PSYCH: He is alert and oriented x1; does not  appear anxious does not appear depressed; affect is normal HEENT: Normocephalic and Atraumatic, Mucous membranes pink; PERRLA; EOM intact; Fundi:  Benign;  No scleral icterus, Nares: Patent, Oropharynx: Clear, Edentulous Dry oral mucosa, Neck:  FROM, no cervical lymphadenopathy nor thyromegaly or carotid bruit; no JVD; Breasts:: Not examined CHEST WALL: No tenderness CHEST: Normal respiration, clear to auscultation bilaterally HEART: Regular rate and rhythm; no murmurs rubs or gallops BACK: No kyphosis or scoliosis; no CVA tenderness ABDOMEN: Positive Bowel Sounds, soft non-tender; no masses, no organomegaly, no pannus.   Rectal Exam: Not done EXTREMITIES: No bone or joint deformity; age-appropriate arthropathy of the hands and knees; no cyanosis, clubbing or edema; no ulcerations. Genitalia: not examined PULSES: 2+ and symmetric SKIN: Normal hydration no rash or ulceration CNS: Cranial nerves 2-12 grossly intact no focal neurologic deficit   Labs & Imaging Results for orders placed during the hospital encounter of 01/11/12 (from the past 48 hour(s))  URINALYSIS, ROUTINE W REFLEX MICROSCOPIC     Status: Abnormal   Collection Time   01/11/12  6:58 PM      Component Value Range Comment   Color, Urine AMBER (*) YELLOW  BIOCHEMICALS MAY BE AFFECTED BY COLOR   APPearance CLEAR  CLEAR     Specific Gravity,  Urine 1.026  1.005 - 1.030     pH 5.5  5.0 - 8.0     Glucose, UA NEGATIVE  NEGATIVE (mg/dL)    Hgb urine dipstick NEGATIVE  NEGATIVE     Bilirubin Urine NEGATIVE  NEGATIVE     Ketones, ur TRACE (*) NEGATIVE (mg/dL)    Protein, ur NEGATIVE  NEGATIVE (mg/dL)    Urobilinogen, UA 1.0  0.0 - 1.0 (mg/dL)    Nitrite NEGATIVE  NEGATIVE     Leukocytes, UA NEGATIVE  NEGATIVE  MICROSCOPIC NOT DONE ON URINES WITH NEGATIVE PROTEIN, BLOOD, LEUKOCYTES, NITRITE, OR GLUCOSE <1000 mg/dL.  CBC     Status: Abnormal   Collection Time   01/11/12  7:55 PM      Component Value Range Comment   WBC 10.2  4.0 -  10.5 (K/uL)    RBC 4.14 (*) 4.22 - 5.81 (MIL/uL)    Hemoglobin 12.2 (*) 13.0 - 17.0 (g/dL)    HCT 16.1 (*) 09.6 - 52.0 (%)    MCV 88.6  78.0 - 100.0 (fL)    MCH 29.5  26.0 - 34.0 (pg)    MCHC 33.2  30.0 - 36.0 (g/dL)    RDW 04.5  40.9 - 81.1 (%)    Platelets 123 (*) 150 - 400 (K/uL)   DIFFERENTIAL     Status: Abnormal   Collection Time   01/11/12  7:55 PM      Component Value Range Comment   Neutrophils Relative 83 (*) 43 - 77 (%)    Neutro Abs 8.5 (*) 1.7 - 7.7 (K/uL)    Lymphocytes Relative 12  12 - 46 (%)    Lymphs Abs 1.2  0.7 - 4.0 (K/uL)    Monocytes Relative 5  3 - 12 (%)    Monocytes Absolute 0.5  0.1 - 1.0 (K/uL)    Eosinophils Relative 0  0 - 5 (%)    Eosinophils Absolute 0.0  0.0 - 0.7 (K/uL)    Basophils Relative 0  0 - 1 (%)    Basophils Absolute 0.0  0.0 - 0.1 (K/uL)   COMPREHENSIVE METABOLIC PANEL     Status: Abnormal   Collection Time   01/11/12  7:55 PM      Component Value Range Comment   Sodium 137  135 - 145 (mEq/L)    Potassium 3.4 (*) 3.5 - 5.1 (mEq/L)    Chloride 102  96 - 112 (mEq/L)    CO2 24  19 - 32 (mEq/L)    Glucose, Bld 123 (*) 70 - 99 (mg/dL)    BUN 23  6 - 23 (mg/dL)    Creatinine, Ser 9.14 (*) 0.50 - 1.35 (mg/dL)    Calcium 8.3 (*) 8.4 - 10.5 (mg/dL)    Total Protein 7.3  6.0 - 8.3 (g/dL)    Albumin 3.1 (*) 3.5 - 5.2 (g/dL)    AST 16  0 - 37 (U/L)    ALT 8  0 - 53 (U/L)    Alkaline Phosphatase 76  39 - 117 (U/L)    Total Bilirubin 0.9  0.3 - 1.2 (mg/dL)    GFR calc non Af Amer 33 (*) >90 (mL/min)    GFR calc Af Amer 38 (*) >90 (mL/min)   LACTIC ACID, PLASMA     Status: Normal   Collection Time   01/11/12  7:55 PM      Component Value Range Comment   Lactic Acid, Venous 2.2  0.5 - 2.2 (mmol/L)  PROCALCITONIN     Status: Normal   Collection Time   01/11/12  7:55 PM      Component Value Range Comment   Procalcitonin 0.22     CARDIAC PANEL(CRET KIN+CKTOT+MB+TROPI)     Status: Abnormal   Collection Time   01/11/12  7:55 PM      Component  Value Range Comment   Total CK 392 (*) 7 - 232 (U/L)    CK, MB 3.3  0.3 - 4.0 (ng/mL)    Troponin I <0.30  <0.30 (ng/mL)    Relative Index 0.8  0.0 - 2.5     Dg Chest 1 View  01/11/2012  *RADIOLOGY REPORT*  Clinical Data: Cough and shortness of breath.  CHEST - 1 VIEW  Comparison: 09/29/2010  Findings: Lungs are clear bilaterally.  Heart size is within normal limits.  Trachea is midline.  The bony structures appear stable.  IMPRESSION: No acute chest findings.  Original Report Authenticated By: Richarda Overlie, M.D.   Ct Head Wo Contrast  01/11/2012  *RADIOLOGY REPORT*  Clinical Data: Cough.  Fever. Weakness.  CT HEAD WITHOUT CONTRAST  Technique:  Contiguous axial images were obtained from the base of the skull through the vertex without contrast.  Comparison: None.  Findings: The brain stem, cerebellum, cerebral peduncles, thalami, basal ganglia, basilar cisterns, and ventricular system appear unremarkable.  Periventricular and corona radiata white matter hypodensities are most compatible with chronic ischemic microvascular white matter disease.  Focal hypodensity medially in the left periventricular white matter is most compatible with a remote lacunar infarct.  No intracranial hemorrhage, mass lesion, or acute infarction is identified.  There is acute-on-chronic bilateral maxillary sinusitis along with opacification of the majority of the ethmoid air cells.  Mucosal thickening in the nasal cavity is present along with acute sphenoid sinusitis.  Bilateral frontal sinusitis is present.  IMPRESSION:  1. Periventricular and corona radiata white matter hypodensities are most compatible with chronic ischemic microvascular white matter disease. 2.  Focal hypodensity in the left periventricular white matter likely represents remote lacunar infarct. 3.  Acute on chronic paranasal sinusitis.  Original Report Authenticated By: Dellia Cloud, M.D.      Assessment: Present on Admission:  .SIRS (systemic  inflammatory response syndrome) .Febrile illness, acute .Encephalopathy acute .Tachycardia .Hypotension .COPD (chronic obstructive pulmonary disease) .CKD (chronic kidney disease)  Plan:    Admit to Stepdown Bed IV Vanc and Zosyn IVFs for Fluid Resuscitation Monitor lytes and Bun/Cr Verify Home Meds DVT Prophylaxis Verify Code Status Other plans as per orders.    CODE STATUS:      FULL CODE        Critical care time: 60 minutes.   Janiesha Diehl C 01/12/2012, 12:46 AM

## 2012-01-12 NOTE — Progress Notes (Addendum)
Subjective: Chart/H&P reviewed ,patient admitted for  Fever/SIRS . Patient seen and examined ,he is alert and oriented x 3 ,speech is slow .  Objective: Vital signs in last 24 hours: Temp:  [98.4 F (36.9 C)-102.5 F (39.2 C)] 98.8 F (37.1 C) (02/25 0700) Pulse Rate:  [82-135] 86  (02/25 0700) Resp:  [18-27] 19  (02/25 0700) BP: (87-121)/(51-69) 115/63 mmHg (02/25 0700) SpO2:  [88 %-100 %] 96 % (02/25 0700) FiO2 (%):  [28 %] 28 % (02/24 1916) Weight:  [101.2 kg (223 lb 1.7 oz)] 101.2 kg (223 lb 1.7 oz) (02/25 0100) Weight change:     Intake/Output from previous day: 02/24 0701 - 02/25 0700 In: 3500 [I.V.:3450; IV Piggyback:50] Out: 1450 [Urine:1450]     Physical Exam: General: Alert, awake, oriented x3, in no acute distress. Heart: Regular rate and rhythm, without murmurs, rubs, gallops. Lungs: diffuse expiratory wheezes bilaterally. Abdomen: Soft, nontender, nondistended, positive bowel sounds. Extremities: No clubbing cyanosis or edema with positive pedal pulses. Neuro:  nonfocal.    Lab Results: Results for orders placed during the hospital encounter of 01/11/12 (from the past 24 hour(s))  URINALYSIS, ROUTINE W REFLEX MICROSCOPIC     Status: Abnormal   Collection Time   01/11/12  6:58 PM      Component Value Range   Color, Urine AMBER (*) YELLOW    APPearance CLEAR  CLEAR    Specific Gravity, Urine 1.026  1.005 - 1.030    pH 5.5  5.0 - 8.0    Glucose, UA NEGATIVE  NEGATIVE (mg/dL)   Hgb urine dipstick NEGATIVE  NEGATIVE    Bilirubin Urine NEGATIVE  NEGATIVE    Ketones, ur TRACE (*) NEGATIVE (mg/dL)   Protein, ur NEGATIVE  NEGATIVE (mg/dL)   Urobilinogen, UA 1.0  0.0 - 1.0 (mg/dL)   Nitrite NEGATIVE  NEGATIVE    Leukocytes, UA NEGATIVE  NEGATIVE   CBC     Status: Abnormal   Collection Time   01/11/12  7:55 PM      Component Value Range   WBC 10.2  4.0 - 10.5 (K/uL)   RBC 4.14 (*) 4.22 - 5.81 (MIL/uL)   Hemoglobin 12.2 (*) 13.0 - 17.0 (g/dL)   HCT 45.4  (*) 09.8 - 52.0 (%)   MCV 88.6  78.0 - 100.0 (fL)   MCH 29.5  26.0 - 34.0 (pg)   MCHC 33.2  30.0 - 36.0 (g/dL)   RDW 11.9  14.7 - 82.9 (%)   Platelets 123 (*) 150 - 400 (K/uL)  DIFFERENTIAL     Status: Abnormal   Collection Time   01/11/12  7:55 PM      Component Value Range   Neutrophils Relative 83 (*) 43 - 77 (%)   Neutro Abs 8.5 (*) 1.7 - 7.7 (K/uL)   Lymphocytes Relative 12  12 - 46 (%)   Lymphs Abs 1.2  0.7 - 4.0 (K/uL)   Monocytes Relative 5  3 - 12 (%)   Monocytes Absolute 0.5  0.1 - 1.0 (K/uL)   Eosinophils Relative 0  0 - 5 (%)   Eosinophils Absolute 0.0  0.0 - 0.7 (K/uL)   Basophils Relative 0  0 - 1 (%)   Basophils Absolute 0.0  0.0 - 0.1 (K/uL)  COMPREHENSIVE METABOLIC PANEL     Status: Abnormal   Collection Time   01/11/12  7:55 PM      Component Value Range   Sodium 137  135 - 145 (mEq/L)   Potassium 3.4 (*)  3.5 - 5.1 (mEq/L)   Chloride 102  96 - 112 (mEq/L)   CO2 24  19 - 32 (mEq/L)   Glucose, Bld 123 (*) 70 - 99 (mg/dL)   BUN 23  6 - 23 (mg/dL)   Creatinine, Ser 1.61 (*) 0.50 - 1.35 (mg/dL)   Calcium 8.3 (*) 8.4 - 10.5 (mg/dL)   Total Protein 7.3  6.0 - 8.3 (g/dL)   Albumin 3.1 (*) 3.5 - 5.2 (g/dL)   AST 16  0 - 37 (U/L)   ALT 8  0 - 53 (U/L)   Alkaline Phosphatase 76  39 - 117 (U/L)   Total Bilirubin 0.9  0.3 - 1.2 (mg/dL)   GFR calc non Af Amer 33 (*) >90 (mL/min)   GFR calc Af Amer 38 (*) >90 (mL/min)  LACTIC ACID, PLASMA     Status: Normal   Collection Time   01/11/12  7:55 PM      Component Value Range   Lactic Acid, Venous 2.2  0.5 - 2.2 (mmol/L)  PROCALCITONIN     Status: Normal   Collection Time   01/11/12  7:55 PM      Component Value Range   Procalcitonin 0.22    CARDIAC PANEL(CRET KIN+CKTOT+MB+TROPI)     Status: Abnormal   Collection Time   01/11/12  7:55 PM      Component Value Range   Total CK 392 (*) 7 - 232 (U/L)   CK, MB 3.3  0.3 - 4.0 (ng/mL)   Troponin I <0.30  <0.30 (ng/mL)   Relative Index 0.8  0.0 - 2.5   MRSA PCR SCREENING      Status: Normal   Collection Time   01/12/12  1:00 AM      Component Value Range   MRSA by PCR NEGATIVE  NEGATIVE   BASIC METABOLIC PANEL     Status: Abnormal   Collection Time   01/12/12  3:10 AM      Component Value Range   Sodium 137  135 - 145 (mEq/L)   Potassium 4.7  3.5 - 5.1 (mEq/L)   Chloride 105  96 - 112 (mEq/L)   CO2 24  19 - 32 (mEq/L)   Glucose, Bld 87  70 - 99 (mg/dL)   BUN 20  6 - 23 (mg/dL)   Creatinine, Ser 0.96 (*) 0.50 - 1.35 (mg/dL)   Calcium 7.9 (*) 8.4 - 10.5 (mg/dL)   GFR calc non Af Amer 37 (*) >90 (mL/min)   GFR calc Af Amer 43 (*) >90 (mL/min)  CBC     Status: Abnormal   Collection Time   01/12/12  3:10 AM      Component Value Range   WBC 10.4  4.0 - 10.5 (K/uL)   RBC 4.18 (*) 4.22 - 5.81 (MIL/uL)   Hemoglobin 12.2 (*) 13.0 - 17.0 (g/dL)   HCT 04.5 (*) 40.9 - 52.0 (%)   MCV 88.5  78.0 - 100.0 (fL)   MCH 29.2  26.0 - 34.0 (pg)   MCHC 33.0  30.0 - 36.0 (g/dL)   RDW 81.1  91.4 - 78.2 (%)   Platelets 122 (*) 150 - 400 (K/uL)    Studies/Results: Dg Chest 1 View  01/11/2012  *RADIOLOGY REPORT*  Clinical Data: Cough and shortness of breath.  CHEST - 1 VIEW  Comparison: 09/29/2010  Findings: Lungs are clear bilaterally.  Heart size is within normal limits.  Trachea is midline.  The bony structures appear stable.  IMPRESSION: No acute chest  findings.  Original Report Authenticated By: Richarda Overlie, M.D.   Ct Head Wo Contrast  01/11/2012  *RADIOLOGY REPORT*  Clinical Data: Cough.  Fever. Weakness.  CT HEAD WITHOUT CONTRAST  Technique:  Contiguous axial images were obtained from the base of the skull through the vertex without contrast.  Comparison: None.  Findings: The brain stem, cerebellum, cerebral peduncles, thalami, basal ganglia, basilar cisterns, and ventricular system appear unremarkable.  Periventricular and corona radiata white matter hypodensities are most compatible with chronic ischemic microvascular white matter disease.  Focal hypodensity medially in  the left periventricular white matter is most compatible with a remote lacunar infarct.  No intracranial hemorrhage, mass lesion, or acute infarction is identified.  There is acute-on-chronic bilateral maxillary sinusitis along with opacification of the majority of the ethmoid air cells.  Mucosal thickening in the nasal cavity is present along with acute sphenoid sinusitis.  Bilateral frontal sinusitis is present.  IMPRESSION:  1. Periventricular and corona radiata white matter hypodensities are most compatible with chronic ischemic microvascular white matter disease. 2.  Focal hypodensity in the left periventricular white matter likely represents remote lacunar infarct. 3.  Acute on chronic paranasal sinusitis.  Original Report Authenticated By: Dellia Cloud, M.D.    Medications:    . sodium chloride  1,000 mL Intravenous Once  . acetaminophen  325 mg Oral Once  . acetaminophen  650 mg Oral Once  . albuterol      . antiseptic oral rinse  15 mL Mouth Rinse BID  . enoxaparin  40 mg Subcutaneous Q24H  . piperacillin-tazobactam (ZOSYN)  IV  3.375 g Intravenous Once  . piperacillin-tazobactam (ZOSYN)  IV  3.375 g Intravenous Q8H  . vancomycin  1,250 mg Intravenous Q24H  . vancomycin  1,000 mg Intravenous Once  . vitamin A & D        acetaminophen, acetaminophen, alum & mag hydroxide-simeth, HYDROmorphone, ondansetron (ZOFRAN) IV, ondansetron, oxyCODONE, zolpidem     . sodium chloride 1,000 mL (01/11/12 2132)  . sodium chloride      Assessment/Plan:  Principal Problem:  *SIRS (systemic inflammatory response syndrome) Active Problems:  Febrile illness, acute  Encephalopathy acute  Tachycardia  Hypotension  COPD (chronic obstructive pulmonary disease)  CKD (chronic kidney disease) Plan: Continue IV antibiotics  And IVF  Follow cultures results  CT head showed no acute events,CXR and UA  negative . Start albuterol and atrovent ,add solumedrol for COPD exacerbation  Patient  is hemodynamically stable now,will transfer to telemetry  ? Baseline mental status ,patient is slightly confused however he is A&O X3 .Confusion is probably secondary to toxic metabolic encephalopathy ,will continue to observe while on antibiotics . PT consult .    LOS: 1 day   Missi Mcmackin 01/12/2012, 7:45 AM

## 2012-01-13 ENCOUNTER — Encounter (HOSPITAL_COMMUNITY): Payer: Self-pay | Admitting: *Deleted

## 2012-01-13 LAB — URINE CULTURE
Colony Count: NO GROWTH
Culture: NO GROWTH

## 2012-01-13 LAB — CBC
HCT: 36 % — ABNORMAL LOW (ref 39.0–52.0)
MCH: 29.1 pg (ref 26.0–34.0)
MCV: 88 fL (ref 78.0–100.0)
RDW: 13.9 % (ref 11.5–15.5)
WBC: 9.1 10*3/uL (ref 4.0–10.5)

## 2012-01-13 LAB — BASIC METABOLIC PANEL
BUN: 24 mg/dL — ABNORMAL HIGH (ref 6–23)
CO2: 21 mEq/L (ref 19–32)
Calcium: 8.6 mg/dL (ref 8.4–10.5)
Chloride: 106 mEq/L (ref 96–112)
Creatinine, Ser: 1.54 mg/dL — ABNORMAL HIGH (ref 0.50–1.35)

## 2012-01-13 MED ORDER — METHYLPREDNISOLONE SODIUM SUCC 125 MG IJ SOLR
60.0000 mg | Freq: Four times a day (QID) | INTRAMUSCULAR | Status: DC
  Administered 2012-01-14 – 2012-01-15 (×6): 60 mg via INTRAVENOUS
  Filled 2012-01-13 (×9): qty 0.96

## 2012-01-13 NOTE — Evaluation (Signed)
Physical Therapy Evaluation Patient Details Name: Zephyr Ridley MRN: [id number] DOB: September 04, 1925 Today's Date: 01/13/2012  Problem List:  Patient Active Problem List  Diagnoses  . SIRS (systemic inflammatory response syndrome)  . Febrile illness, acute  . Encephalopathy acute  . Tachycardia  . Hypotension  . COPD (chronic obstructive pulmonary disease)  . HTN (hypertension)  . Hyperlipidemia  . CKD (chronic kidney disease)    Past Medical History:  Past Medical History  Diagnosis Date  . COPD (chronic obstructive pulmonary disease)   . HTN (hypertension)   . Hyperlipidemia   . CKD (chronic kidney disease)    Past Surgical History:  Past Surgical History  Procedure Date  . Cholecystectomy     PT Assessment/Plan/Recommendation PT Assessment Clinical Impression Statement: Pt with diagnosis of SIRS.  Pt would benefit from acute PT services in order to improve independence with transfers and ambulation, improve LE strength, and increase activity tolerance to prepare for d/c to next level of care.  Pt reports he lives alone and does not have anyone who would be able to assist him upon d/c. PT Recommendation/Assessment: Patient will need skilled PT in the acute care venue PT Problem List: Decreased strength;Decreased activity tolerance;Decreased mobility;Decreased knowledge of use of DME;Pain Barriers to Discharge: Decreased caregiver support PT Therapy Diagnosis : Difficulty walking;Generalized weakness PT Plan PT Frequency: Min 3X/week PT Treatment/Interventions: DME instruction;Gait training;Functional mobility training;Therapeutic activities;Therapeutic exercise;Patient/family education PT Recommendation Follow Up Recommendations: Skilled nursing facility Equipment Recommended: Defer to next venue PT Goals  Acute Rehab PT Goals PT Goal Formulation: With patient Time For Goal Achievement: 7 days Pt will go Supine/Side to Sit: with modified independence PT Goal: Supine/Side  to Sit - Progress: Goal set today Pt will go Sit to Stand: with supervision PT Goal: Sit to Stand - Progress: Goal set today Pt will go Stand to Sit: with supervision PT Goal: Stand to Sit - Progress: Goal set today Pt will Ambulate: >150 feet;with supervision;with least restrictive assistive device (no rest breaks) PT Goal: Ambulate - Progress: Goal set today Pt will Perform Home Exercise Program: with supervision, verbal cues required/provided PT Goal: Perform Home Exercise Program - Progress: Goal set today  PT Evaluation Precautions/Restrictions    Prior Functioning  Home Living Lives With: Alone Type of Home: Apartment Home Layout: One level Home Access: Level entry Home Adaptive Equipment: Straight cane;Walker - standard Prior Function Level of Independence: Requires assistive device for independence Comments: Pt uses cane in house and walker in community Cognition Cognition Arousal/Alertness: Awake/alert Overall Cognitive Status: Appears within functional limits for tasks assessed Sensation/Coordination   Extremity Assessment RLE Strength RLE Overall Strength Comments: grossly at least 3+/5 per functional observation LLE Strength LLE Overall Strength Comments: grossly at least 3+/5 per functional observation Mobility (including Balance) Bed Mobility Bed Mobility: Yes Supine to Sit: 5: Supervision Supine to Sit Details (indicate cue type and reason): increased time and verbal cues Transfers Transfers: Yes Sit to Stand: 4: Min assist;From elevated surface;With upper extremity assist;From bed Sit to Stand Details (indicate cue type and reason): assist to rise, verbal cues for hand placement Stand to Sit: 4: Min assist;With armrests;With upper extremity assist;To chair/3-in-1 Stand to Sit Details: assist to control descent, verbal cues for hand placement Ambulation/Gait Ambulation/Gait: Yes Ambulation/Gait Assistance: 4: Min assist Ambulation/Gait Assistance Details  (indicate cue type and reason): +2 for lines and recliner to follow, pt fatigues easily with a couple short standing rest breaks needed, verbal cues for posture and safe RW distance, pt reports  increased pain due to arthritis in LEs Ambulation Distance (Feet): 80 Feet Assistive device: Rolling walker Gait Pattern: Trunk flexed    Exercise    End of Session PT - End of Session Equipment Utilized During Treatment: Gait belt Activity Tolerance: Patient limited by fatigue;Patient limited by pain Patient left: in chair;with call bell in reach Nurse Communication: Mobility status for transfers General Behavior During Session: [facility] for tasks performed Cognition: [facility] for tasks performed  Joeli Fenner,KATHrine E 01/13/2012, 12:06 PM Pager: [phone]

## 2012-01-13 NOTE — Progress Notes (Signed)
Subjective: Patient seen and examined ,feeling better today ,more alert and interactive ,still a bit confused .  Objective: Vital signs in last 24 hours: Temp:  [98.3 F (36.8 C)-98.8 F (37.1 C)] 98.5 F (36.9 C) (02/26 0959) Pulse Rate:  [66-84] 75  (02/26 0959) Resp:  [18] 18  (02/26 0959) BP: (129-163)/(58-81) 146/64 mmHg (02/26 0959) SpO2:  [95 %-100 %] 95 % (02/26 0959) Weight change:  Last BM Date: 01/12/12  Intake/Output from previous day: 02/25 0701 - 02/26 0700 In: 2780.8 [P.O.:480; I.V.:2163.3; IV Piggyback:137.5] Out: 1650 [Urine:1650] Total I/O In: 240 [P.O.:240] Out: -    Physical Exam: General: Alert, awake, confused, in no acute distress. Heart: Regular rate and rhythm, without murmurs, rubs, gallops. Lungs: Clear to auscultation bilaterally. Abdomen: Soft, nontender, nondistended, positive bowel sounds. Extremities: No clubbing cyanosis or edema with positive pedal pulses. Neuro: Grossly intact, nonfocal.    Lab Results: Results for orders placed during the hospital encounter of 01/11/12 (from the past 24 hour(s))  MAGNESIUM     Status: Normal   Collection Time   01/12/12 11:35 PM      Component Value Range   Magnesium 2.1  1.5 - 2.5 (mg/dL)  CBC     Status: Abnormal   Collection Time   01/13/12  5:11 AM      Component Value Range   WBC 9.1  4.0 - 10.5 (K/uL)   RBC 4.09 (*) 4.22 - 5.81 (MIL/uL)   Hemoglobin 11.9 (*) 13.0 - 17.0 (g/dL)   HCT 16.1 (*) 09.6 - 52.0 (%)   MCV 88.0  78.0 - 100.0 (fL)   MCH 29.1  26.0 - 34.0 (pg)   MCHC 33.1  30.0 - 36.0 (g/dL)   RDW 04.5  40.9 - 81.1 (%)   Platelets 150  150 - 400 (K/uL)  BASIC METABOLIC PANEL     Status: Abnormal   Collection Time   01/13/12  5:11 AM      Component Value Range   Sodium 137  135 - 145 (mEq/L)   Potassium 3.6  3.5 - 5.1 (mEq/L)   Chloride 106  96 - 112 (mEq/L)   CO2 21  19 - 32 (mEq/L)   Glucose, Bld 194 (*) 70 - 99 (mg/dL)   BUN 24 (*) 6 - 23 (mg/dL)   Creatinine, Ser 9.14 (*)  0.50 - 1.35 (mg/dL)   Calcium 8.6  8.4 - 78.2 (mg/dL)   GFR calc non Af Amer 40 (*) >90 (mL/min)   GFR calc Af Amer 46 (*) >90 (mL/min)    Studies/Results:  Medications:    . antiseptic oral rinse  15 mL Mouth Rinse BID  . enoxaparin  40 mg Subcutaneous Q24H  . methylPREDNISolone (SOLU-MEDROL) injection  60 mg Intravenous Q4H  . piperacillin-tazobactam (ZOSYN)  IV  3.375 g Intravenous Q8H  . vancomycin  1,250 mg Intravenous Q24H    acetaminophen, acetaminophen, albuterol, alum & mag hydroxide-simeth, HYDROmorphone, ipratropium, ondansetron (ZOFRAN) IV, ondansetron, oxyCODONE, zolpidem     . sodium chloride 75 mL/hr at 01/13/12 1341  . DISCONTD: sodium chloride 1,000 mL (01/11/12 2132)    Assessment/Plan:  Principal Problem:  *SIRS (systemic inflammatory response syndrome) Active Problems:  Febrile illness, acute  Encephalopathy acute  Tachycardia  Hypotension  COPD (chronic obstructive pulmonary disease)  CKD (chronic kidney disease) Plan:  Blood culture grew GPC in pairs and chains in 1/2 bottles ,? Contaminant  .Urine culture negative .Continue IV antibiotics And IVF .follow culture I&S and tailor antibiotics accordingly. CT  head showed no acute events,CXR negative .  Continue  albuterol and atrovent , solumedrol (taper)for COPD exacerbation   PT recommending SNF.    LOS: 2 days   Austin Krause 01/13/2012, 3:21 PM

## 2012-01-14 DIAGNOSIS — R7881 Bacteremia: Secondary | ICD-10-CM | POA: Diagnosis present

## 2012-01-14 LAB — CULTURE, BLOOD (ROUTINE X 2): Culture  Setup Time: 201302250144

## 2012-01-14 LAB — BASIC METABOLIC PANEL
BUN: 24 mg/dL — ABNORMAL HIGH (ref 6–23)
Calcium: 8.6 mg/dL (ref 8.4–10.5)
Creatinine, Ser: 1.47 mg/dL — ABNORMAL HIGH (ref 0.50–1.35)
GFR calc Af Amer: 49 mL/min — ABNORMAL LOW (ref >90)
GFR calc non Af Amer: 42 mL/min — ABNORMAL LOW (ref >90)
Glucose, Bld: 170 mg/dL — ABNORMAL HIGH (ref 70–99)

## 2012-01-14 LAB — CBC
HCT: 35.1 % — ABNORMAL LOW (ref 39.0–52.0)
Hemoglobin: 11.8 g/dL — ABNORMAL LOW (ref 13.0–17.0)
MCH: 29.3 pg (ref 26.0–34.0)
MCHC: 33.6 g/dL (ref 30.0–36.0)
RDW: 13.9 % (ref 11.5–15.5)

## 2012-01-14 MED ORDER — VANCOMYCIN HCL IN DEXTROSE 1-5 GM/200ML-% IV SOLN
1000.0000 mg | Freq: Two times a day (BID) | INTRAVENOUS | Status: DC
  Administered 2012-01-14 – 2012-01-15 (×3): 1000 mg via INTRAVENOUS
  Filled 2012-01-14 (×6): qty 200

## 2012-01-14 NOTE — Progress Notes (Signed)
ANTIBIOTIC CONSULT NOTE - FOLLOW UP  Pharmacy Consult for Vanc/Zosyn Indication: SIRS  No Known Allergies  Patient Measurements: Height: 6' (182.9 cm) Weight: 223 lb 1.7 oz (101.2 kg) IBW/kg (Calculated) : 77.6   Vital Signs: Temp: 97.9 F (36.6 C) (02/27 1406) Temp src: Oral (02/27 1406) BP: 154/56 mmHg (02/27 1406) Pulse Rate: 55  (02/27 1406) Intake/Output from previous day: 02/26 0701 - 02/27 0700 In: 2695 [P.O.:960; I.V.:1135; IV Piggyback:600] Out: 400 [Urine:400] Intake/Output from this shift: Total I/O In: -  Out: 400 [Urine:400]  Labs:  [facility] 01/14/12 0500 01/13/12 0511 01/12/12 0310  WBC 10.7* 9.1 10.4  HGB 11.8* 11.9* 12.2*  PLT 163 150 122*  LABCREA -- -- --  CREATININE 1.47* 1.54* 1.64*   Estimated Creatinine Clearance: 46 ml/min (by C-G formula based on Cr of 1.47).  Basename 01/14/12 1430  VANCOTROUGH 8.8*  VANCOPEAK --  Drue Dun --  GENTTROUGH --  GENTPEAK --  GENTRANDOM --  TOBRATROUGH --  TOBRAPEAK --  TOBRARND --  AMIKACINPEAK --  AMIKACINTROU --  AMIKACIN --    Assessment:  12 YOM on Day #4 of Vanc/Zosyn for SIR.    Afebrile, WBC trending back up (10.7)  Scr improving, CrCl 46 ml/min, Normalized CrCl 38 ml/min  2/24 blood culture 1/2 positive for strep viridans.  ?contaminant?  VT today 8.8 on vanc 1250 mg IV q24h, goal of 15-20  Goal of Therapy:  Vancomycin trough level 15-20 mcg/ml  Plan:   Change Vancomycin to 1gm IV q12h  Next dose due now  Continue Zosyn same  Geoffry Paradise Thi 01/14/2012,3:12 PM

## 2012-01-14 NOTE — Progress Notes (Signed)
CSW received call from [place] @ [facility] (ph#: [phone]) informing that Mr. Sawaya is a current patient of [facility]. They are sending their Physical Therapist out to assess patient to see if they are able to take patient back to their program rather than d/c to SNF. CSW will follow-up.   Unice Bailey, LCSWA [phone]

## 2012-01-14 NOTE — Progress Notes (Signed)
PCP: Thane Edu, MD, MD  Brief HPI:  Austin Krause is an 78 y.o. male who was brought to the ED secondary to increased confusion and fever to 102. He has had myalgias. His daughter was at the bedside. He has not had any cough or nausea or vomiting or diarrhea. In the ED he was found to have fever and tachycardia and hypotension. Blood cultures were sent and a Lactate level was done and found to be elevated at 2.2. He was also found to have a WBC count of 10.2. He was placed on IV antibiotic therapy of Vancomycin and Zosyn.    Past medical history:  Past Medical History   Diagnosis  Date   .  COPD (chronic obstructive pulmonary disease)    .  HTN (hypertension)    .  Hyperlipidemia    .  CKD (chronic kidney disease)      Consultants: None  Procedures: None  Subjective: Patient feels better. Denies any complaints.  Objective: Vital signs in last 24 hours: Temp:  [97.5 F (36.4 C)-98.5 F (36.9 C)] 97.9 F (36.6 C) (02/27 1406) Pulse Rate:  [52-76] 55  (02/27 1406) Resp:  [18-20] 20  (02/27 1406) BP: (142-159)/(56-72) 154/56 mmHg (02/27 1406) SpO2:  [97 %-100 %] 100 % (02/27 1406) Weight change:  Last BM Date: 01/12/12  Intake/Output from previous day: 02/26 0701 - 02/27 0700 In: 2695 [P.O.:960; I.V.:1135; IV Piggyback:600] Out: 400 [Urine:400] Intake/Output this shift: Total I/O In: -  Out: 400 [Urine:400]  General appearance: alert, cooperative and no distress Head: Normocephalic, without obvious abnormality, atraumatic Eyes: conjunctivae/corneas clear. PERRL, EOM's intact. Neck: no adenopathy, no carotid bruit, no JVD, supple, symmetrical, trachea midline and thyroid not enlarged, symmetric, no tenderness/mass/nodules Resp: clear to auscultation bilaterally Cardio: regular rate and rhythm, S1, S2 normal, no murmur, click, rub or gallop GI: soft, non-tender; bowel sounds normal; no masses,  no organomegaly Male genitalia: normal Extremities:  extremities normal, atraumatic, no cyanosis or edema Pulses: 2+ and symmetric Skin: Skin color, texture, turgor normal. No rashes or lesions Neurologic: Grossly normal  Lab Results:  Basename 01/14/12 0500 01/13/12 0511  WBC 10.7* 9.1  HGB 11.8* 11.9*  HCT 35.1* 36.0*  PLT 163 150   BMET  Basename 01/14/12 0500 01/13/12 0511  NA 138 137  K 3.7 3.6  CL 107 106  CO2 20 21  GLUCOSE 170* 194*  BUN 24* 24*  CREATININE 1.47* 1.54*  CALCIUM 8.6 8.6    Studies/Results: No results found.  Medications:  Scheduled:    . antiseptic oral rinse  15 mL Mouth Rinse BID  . enoxaparin  40 mg Subcutaneous Q24H  . methylPREDNISolone (SOLU-MEDROL) injection  60 mg Intravenous Q6H  . piperacillin-tazobactam (ZOSYN)  IV  3.375 g Intravenous Q8H  . vancomycin  1,000 mg Intravenous Q12H  . DISCONTD: methylPREDNISolone (SOLU-MEDROL) injection  60 mg Intravenous Q4H  . DISCONTD: vancomycin  1,250 mg Intravenous Q24H    Assessment/Plan:  Principal Problem:  *SIRS (systemic inflammatory response syndrome) Active Problems:  Febrile illness, acute  Encephalopathy acute  Tachycardia  Hypotension  COPD (chronic obstructive pulmonary disease)  CKD (chronic kidney disease)  Bacteremia    1. SIRS/Bacteremia: Strep viridans in 1/2 bottles. Could be contaminant. Check ECHO. Will discuss with ID in AM. Continue antibiotics for now.  2. Fever: No other source for fever. Resolved  3. Acute Encephalopathy: Improved. Secondary to above.  4. Hypotension and tachycardia: Resolved  5. COPD: Stable  6. CKD: Stable  [facility]fully back to [facility] program at discharge even though PT recommended SNF on 2/26. Will await re-evaluation.   LOS: 3 days   [facility] Pager [phone] 01/14/2012, 3:54 PM

## 2012-01-14 NOTE — Progress Notes (Signed)
UR completed 

## 2012-01-14 NOTE — Progress Notes (Signed)
RN and SW from [facility] would like to know when patient will be ready for DC.  MD or SW, please give Irving Burton, SW, a call at [phone]. Thanks!

## 2012-01-15 ENCOUNTER — Inpatient Hospital Stay (HOSPITAL_COMMUNITY): Payer: Medicare (Managed Care)

## 2012-01-15 LAB — CBC
MCH: 28.8 pg (ref 26.0–34.0)
MCHC: 33.1 g/dL (ref 30.0–36.0)
MCV: 87.1 fL (ref 78.0–100.0)
Platelets: 151 10*3/uL (ref 150–400)
RDW: 13.9 % (ref 11.5–15.5)

## 2012-01-15 LAB — BASIC METABOLIC PANEL
CO2: 23 mEq/L (ref 19–32)
Calcium: 8.3 mg/dL — ABNORMAL LOW (ref 8.4–10.5)
Creatinine, Ser: 1.44 mg/dL — ABNORMAL HIGH (ref 0.50–1.35)
GFR calc Af Amer: 50 mL/min — ABNORMAL LOW (ref >90)
GFR calc non Af Amer: 43 mL/min — ABNORMAL LOW (ref >90)

## 2012-01-15 MED ORDER — MOXIFLOXACIN HCL 400 MG PO TABS
400.0000 mg | ORAL_TABLET | Freq: Every day | ORAL | Status: DC
  Administered 2012-01-15: 400 mg via ORAL
  Filled 2012-01-15 (×2): qty 1

## 2012-01-15 MED ORDER — HYDRALAZINE HCL 20 MG/ML IJ SOLN: 10.0000 mg | Freq: Four times a day (QID) | INTRAMUSCULAR | Status: DC | PRN

## 2012-01-15 MED ORDER — AMLODIPINE BESYLATE 10 MG PO TABS
10.0000 mg | ORAL_TABLET | Freq: Every day | ORAL | Status: DC
  Administered 2012-01-15 – 2012-01-16 (×2): 10 mg via ORAL
  Filled 2012-01-15 (×2): qty 1

## 2012-01-15 MED ORDER — FLUTICASONE-SALMETEROL 100-50 MCG/DOSE IN AEPB
1.0000 | INHALATION_SPRAY | Freq: Two times a day (BID) | RESPIRATORY_TRACT | Status: DC
  Administered 2012-01-15: 1 via RESPIRATORY_TRACT
  Filled 2012-01-15: qty 14

## 2012-01-15 MED ORDER — DOCUSATE SODIUM 100 MG PO CAPS
100.0000 mg | ORAL_CAPSULE | Freq: Two times a day (BID) | ORAL | Status: DC
  Administered 2012-01-15 – 2012-01-16 (×3): 100 mg via ORAL
  Filled 2012-01-15 (×4): qty 1

## 2012-01-15 MED ORDER — IPRATROPIUM BROMIDE 0.02 % IN SOLN
0.5000 mg | Freq: Four times a day (QID) | RESPIRATORY_TRACT | Status: DC
  Administered 2012-01-15 – 2012-01-16 (×3): 0.5 mg via RESPIRATORY_TRACT
  Filled 2012-01-15 (×5): qty 2.5

## 2012-01-15 MED ORDER — ATORVASTATIN CALCIUM 40 MG PO TABS
40.0000 mg | ORAL_TABLET | Freq: Every day | ORAL | Status: DC
  Administered 2012-01-15 – 2012-01-16 (×2): 40 mg via ORAL
  Filled 2012-01-15 (×2): qty 1

## 2012-01-15 MED ORDER — ALBUTEROL SULFATE (5 MG/ML) 0.5% IN NEBU
2.5000 mg | INHALATION_SOLUTION | Freq: Four times a day (QID) | RESPIRATORY_TRACT | Status: DC
  Administered 2012-01-15 – 2012-01-16 (×3): 2.5 mg via RESPIRATORY_TRACT
  Filled 2012-01-15 (×5): qty 0.5

## 2012-01-15 MED ORDER — PREDNISONE 20 MG PO TABS
40.0000 mg | ORAL_TABLET | Freq: Two times a day (BID) | ORAL | Status: DC
  Administered 2012-01-15 – 2012-01-16 (×2): 40 mg via ORAL
  Filled 2012-01-15 (×4): qty 2

## 2012-01-15 MED ORDER — DONEPEZIL HCL 10 MG PO TABS
10.0000 mg | ORAL_TABLET | Freq: Every day | ORAL | Status: DC
  Administered 2012-01-15: 10 mg via ORAL
  Filled 2012-01-15 (×2): qty 1

## 2012-01-15 NOTE — Progress Notes (Signed)
Physical Therapy Treatment Patient Details Name: Austin Krause MRN: [id number] DOB: 03/31/1925 Today's Date: 01/15/2012  PT Assessment/Plan  PT - Assessment/Plan Comments on Treatment Session: Pt agreeable to ambulation and currently min/guard assist for mobility.  Per chart, [facility] has evaluated pt and accepted back in program and will provide care upon return home.  Recommend supervision for pt with OOB/mobility and HHPT upon return home.  If [facility] does not provide this, recommend SNF. PT Plan: Discharge plan remains appropriate;Frequency remains appropriate Follow Up Recommendations: Supervision for mobility/OOB;Skilled nursing facility (HHPT w/ supervision for mobility or SNF) Equipment Recommended: Other (comment);Defer to next venue (or defer to [facility]) PT Goals  Acute Rehab PT Goals PT Goal: Sit to Stand - Progress: Progressing toward goal PT Goal: Stand to Sit - Progress: Progressing toward goal PT Goal: Ambulate - Progress: Progressing toward goal  PT Treatment Precautions/Restrictions  Precautions Precautions: Fall Restrictions Weight Bearing Restrictions: No Mobility (including Balance) Bed Mobility Bed Mobility: No Transfers Transfers: Yes Sit to Stand: 4: Min assist;From toilet Sit to Stand Details (indicate cue type and reason): min/guard Stand to Sit: 4: Min assist;With upper extremity assist Stand to Sit Details: min/guard, verbal cues for armrests Ambulation/Gait Ambulation/Gait: Yes Ambulation/Gait Assistance: 4: Min assist Ambulation/Gait Assistance Details (indicate cue type and reason): min/guard, pt with minimal SOB requiring a couple quick standing rest breaks (however pt constantly talking so encouraged to be silent for energy conservation), increased verbal cues for posture and safe RW distance Ambulation Distance (Feet): 140 Feet (total) Assistive device: Rolling walker Gait Pattern: Trunk flexed Gait velocity: slow    Exercise    End of Session PT - End  of Session Equipment Utilized During Treatment: Gait belt Activity Tolerance: Patient limited by fatigue Patient left: in chair;with call bell in reach General Behavior During Session: [facility] for tasks performed Cognition: [facility] for tasks performed  Austin Krause,KATHrine E 01/15/2012, 4:27 PM Pager: [phone]

## 2012-01-15 NOTE — Progress Notes (Signed)
01-15-12 Spoke with Irving Burton, SW with [facility] program at [phone] to confirm Mr. Steiner dc plans. Irving Burton states therapy assessment has been done and therapy with [facility] thinks patient is appropriate to return to their program. Mr Bonadonna will be at [facility] facility 5 times a week and will have additional home care services at his home morning and evening for several weeks after initial dc from hospital. This CM attempted to call dtr, Melven Sartorius at [phone] to also confirm dc plans but there was no answer or a vm to leave message on. No dme is needed per Irving Burton with [facility]. Irving Burton requests that [facility] be called for transportation upon dc tomorrow if patient to discharge at [phone].  [place], [place] [phone]

## 2012-01-15 NOTE — Progress Notes (Signed)
  Echocardiogram 2D Echocardiogram has been performed.  Cathie Beams Deneen 01/15/2012, 10:05 AM

## 2012-01-15 NOTE — Progress Notes (Signed)
PCP: Thane Edu, MD, MD  Brief HPI:  Austin Krause is an 78 y.o. male who was brought to the ED secondary to increased confusion and fever to 102. He has had myalgias. His daughter was at the bedside. He has not had any cough or nausea or vomiting or diarrhea. In the ED he was found to have fever and tachycardia and hypotension. Blood cultures were sent and a Lactate level was done and found to be elevated at 2.2. He was also found to have a WBC count of 10.2. He was placed on IV antibiotic therapy of Vancomycin and Zosyn.    Past medical history:  Past Medical History   Diagnosis  Date   .  COPD (chronic obstructive pulmonary disease)    .  HTN (hypertension)    .  Hyperlipidemia    .  CKD (chronic kidney disease)      Consultants: None  Procedures: None  Subjective: Patient feels better. Has chronic pain associated with arthritis. Denies new complaints.  Objective: Vital signs in last 24 hours: Temp:  [97.5 F (36.4 C)-98.3 F (36.8 C)] 98.3 F (36.8 C) (02/28 0535) Pulse Rate:  [50-62] 62  (02/28 0535) Resp:  [18-20] 20  (02/28 0535) BP: (154-179)/(56-92) 178/77 mmHg (02/28 0535) SpO2:  [95 %-100 %] 95 % (02/28 0814) Weight change:  Last BM Date: 01/12/12  Intake/Output from previous day: 02/27 0701 - 02/28 0700 In: 2692.5 [P.O.:720; I.V.:1422.5; IV Piggyback:550] Out: 600 [Urine:600] Intake/Output this shift:    General appearance: alert, cooperative and no distress Head: Normocephalic, without obvious abnormality, atraumatic Eyes: conjunctivae/corneas clear. PERRL, EOM's intact. Resp: End expiratory wheezing bilaterally. Few crackles at bases. Cardio: regular rate and rhythm, S1, S2 normal, no murmur, click, rub or gallop GI: soft, non-tender; bowel sounds normal; no masses,  no organomegaly Extremities: extremities normal, atraumatic, no cyanosis or edema Pulses: 2+ and symmetric Skin: Skin color, texture, turgor normal. No rashes or  lesions Neurologic: Grossly normal  Lab Results:  Basename 01/15/12 0519 01/14/12 0500  WBC 10.0 10.7*  HGB 11.8* 11.8*  HCT 35.7* 35.1*  PLT 151 163   BMET  Basename 01/15/12 0519 01/14/12 0500  NA 136 138  K 3.5 3.7  CL 103 107  CO2 23 20  GLUCOSE 155* 170*  BUN 24* 24*  CREATININE 1.44* 1.47*  CALCIUM 8.3* 8.6    Studies/Results: No results found.  Medications:  Scheduled:    . antiseptic oral rinse  15 mL Mouth Rinse BID  . enoxaparin  40 mg Subcutaneous Q24H  . methylPREDNISolone (SOLU-MEDROL) injection  60 mg Intravenous Q6H  . piperacillin-tazobactam (ZOSYN)  IV  3.375 g Intravenous Q8H  . vancomycin  1,000 mg Intravenous Q12H  . DISCONTD: vancomycin  1,250 mg Intravenous Q24H    Assessment/Plan:  Principal Problem:  *SIRS (systemic inflammatory response syndrome) Active Problems:  Febrile illness, acute  Encephalopathy acute  Tachycardia  Hypotension  COPD (chronic obstructive pulmonary disease)  CKD (chronic kidney disease)  Bacteremia    1. SIRS/Bacteremia: Strep viridans in 1/2 bottles. Could be contaminant. ECHO pending. Discussed briefly with Dr. Daiva Eves. He thinks the bacteremia could be contaminant but recommended CT abdomen as we dont have a source for his fever to begin with. Continue antibiotics for now. If CT is unremarkable will likely discontinue vancomycin. Discontinue Zosyn for now.  2. Fever: No other source for fever. Could have been viral. See above.  3. Wheezing: Has history of COPD. Start nebs. Avelox.  4.  Acute Encephalopathy: Resolved and back to baseline.  5. Hypotension and tachycardia: Resolved. Now hypertensive.  6. HTN: Norvasc. Avoid HCTZ and ACEI for now.  7. CKD: Stable  Hopefully back to [facility] program at discharge even though PT recommended SNF on 2/26. Patient appears to be back to baseline. Discussed with Dr. Dorothe Pea with [facility] today. Will keep him apprised. But it possible he may be able to go back to home in  AM.   LOS: 4 days   [facility] Pager [phone] 01/15/2012, 11:19 AM

## 2012-01-16 MED ORDER — PREDNISONE 20 MG PO TABS: ORAL_TABLET | ORAL | Status: DC

## 2012-01-16 MED ORDER — CLOTRIMAZOLE 1 % EX CREA
TOPICAL_CREAM | Freq: Two times a day (BID) | CUTANEOUS | Status: DC
Start: 2012-01-16 — End: 2012-01-16
  Filled 2012-01-16: qty 15

## 2012-01-16 MED ORDER — MOXIFLOXACIN HCL 400 MG PO TABS: 400.0000 mg | ORAL_TABLET | Freq: Every day | ORAL | Status: AC

## 2012-01-16 MED ORDER — CLOTRIMAZOLE 1 % EX CREA: TOPICAL_CREAM | Freq: Two times a day (BID) | CUTANEOUS | Status: AC

## 2012-01-16 MED ORDER — AMLODIPINE BESYLATE 10 MG PO TABS: 10.0000 mg | ORAL_TABLET | Freq: Every day | ORAL | Status: DC

## 2012-01-16 MED ORDER — METOPROLOL TARTRATE 50 MG PO TABS
50.0000 mg | ORAL_TABLET | Freq: Once | ORAL | Status: AC
  Administered 2012-01-16: 50 mg via ORAL
  Filled 2012-01-16: qty 1

## 2012-01-16 MED ORDER — LISINOPRIL 20 MG PO TABS: 20.0000 mg | ORAL_TABLET | Freq: Every day | ORAL | Status: DC

## 2012-01-16 NOTE — Progress Notes (Signed)
Attempted to restart pt's IV after infiltrate noted. Also notified at this time that the pt had 5 bts of SVT at 0307. VS obtained on the pt were BP 181/73, P 59, T  97.8, R 18, 94% RA   . Pt was extremely nervous and anxious about having IV restarted and also started to have tremors. Pt states he is scared of needles. Will recheck BP once pt is more relaxed.

## 2012-01-16 NOTE — Progress Notes (Signed)
Paged Dr. Elisabeth Pigeon to notify of the 5bts of SVT that happened at 0307 and reported to the RN by the monitor tech at 0405.

## 2012-01-16 NOTE — Progress Notes (Signed)
Paged Dr. Elisabeth Pigeon to notify of inability to restart IV. Vancomycin due at 0400 not give. Waiting return page.

## 2012-01-16 NOTE — Discharge Summary (Signed)
Physician Discharge Summary  Patient ID: Austin Krause MRN: [id number] DOB/AGE: 1925/11/14 78 y.o.  Admit date: 01/11/2012 Discharge date: 01/16/2012  PCP: Thane Edu, MD, MD  Discharge Diagnoses:  Active Problems:  COPD (chronic obstructive pulmonary disease)  CKD (chronic kidney disease)  Bacteremia   Discharged Condition: fair  Initial History: Austin Krause is an 78 y.o. male who was brought to the ED secondary to increased confusion and fever to 102. He has had myalgias. His daughter was at the bedside. He has not had any cough or nausea or vomiting or diarrhea. In the ED he was found to have fever and tachycardia and hypotension. Blood cultures were sent and a Lactate level was done and found to be elevated at 2.2. He was also found to have a WBC count of 10.2. He was placed on IV antibiotic therapy of Vancomycin and Zosyn.   Hospital Course:  1. SIRS/Bacteremia: Patient was found to have strep viridans in one out of two blood cultures. No other source for infection was found. With IV fluids patient improved quickly. His fever subsided. I discussed this the briefly with Dr. Algis Liming with infectious disease, and we feel that since the bacterium was identified only, in one of 2 blood cultures, this was most likely a contaminant. Patient was taken off of the antibiotics. Echocardiogram did not show any abnormalities that were were concerning. CT scan of the abdomen, pelvis did not show any focus of infection. It is quite possible that patient may have had a viral syndrome. His white count was not significantly elevated. And he had no elevation in his lactic acid, or Procalcitonin on so, a systemic infection, was less likely. Patient has been instructed that if he has fever again or develops worsening shortness of breath or chest pains, feels dizzy or lightheaded, he needs to seek attention immediately.   2. Wheezing: This was noted yesterday. He was given nebulizer treatments  and steroids, and, his wheezing is significantly improved. He'll be continued on Avelox for 4 more days.   3. Acute Encephalopathy: Resolved and back to baseline. This is thought to be secondary to fever.  4. HTN: Because of his chronic renal failure, I have discontinued the patient's hydrochlorothiazide. ACE inhibitors can be continued at this time. Norvasc was also added for better blood pressure control. His blood pressure will need to be closely monitored.   5. CKD: Stable   Today patient mentioned pain in his penis. Erythematous ulceration was noted in the glans. This is thought to be Balanitis. Anti fungal agents were prescribed. Further management per PCP.  I have discussed the patient's clinical course with the Dr. Marny Lowenstein with [facility] and he will follow the patient closely. They have increased his home services as well.   PERTINENT LABS Initial creatinine was 1.80, which improved to 1.4. 4. White cell count was 10.2, with 83% neutrophils. No bands were reported. Lactic acid was 2.2, Pro calcitonin was 0.22. His UA was unremarkable. His urine cultures did not show any growth. Blood cultures grew strep viridans in one out of 2 bottles.   IMAGING STUDIES Ct Abdomen Pelvis Wo Contrast  01/15/2012  *RADIOLOGY REPORT*  Clinical Data: Fever unknown origin, tachycardia  CT ABDOMEN AND PELVIS WITHOUT CONTRAST  Technique:  Multidetector CT imaging of the abdomen and pelvis was performed following the standard protocol without intravenous contrast.  Comparison: CT 01/1979 01/2010  Findings: The bilateral small pleural effusions.  Non-IV contrast images demonstrate no focal hepatic lesion.  The gallbladder is absent.  The pancreas, spleen, adrenal glands, and kidneys are normal.  The stomach, small bowel, appendix, and cecum are normal.  There are diverticula throughout the colon without evidence of acute inflammation.  Abdominal aorta is calcified without  sclerotic plaque.  No  retroperitoneal lymphadenopathy.  No free fluid in the pelvis. The prostate gland and bladder are normal. Small amount of gas collects non dependently within the bladder.   No pelvic lymphadenopathy.  There are small left  internal iliac lymph nodes measuring up to 8 mm (image 64).  Review bone windows demonstrates no aggressive osseous lesions.  IMPRESSION:  1.  No explanation for fever. 2.  Diverticulosis without evidence diverticulitis.  3.  Small amount gas in the bladder is present presumably related to catheterization,  recommend clinical correlation.  Original Report Authenticated By: Genevive Bi, M.D.   Dg Chest 1 View  01/11/2012  *RADIOLOGY REPORT*  Clinical Data: Cough and shortness of breath.  CHEST - 1 VIEW  Comparison: 09/29/2010  Findings: Lungs are clear bilaterally.  Heart size is within normal limits.  Trachea is midline.  The bony structures appear stable.  IMPRESSION: No acute chest findings.  Original Report Authenticated By: Richarda Overlie, M.D.   Ct Head Wo Contrast  01/11/2012  *RADIOLOGY REPORT*  Clinical Data: Cough.  Fever. Weakness.  CT HEAD WITHOUT CONTRAST  Technique:  Contiguous axial images were obtained from the base of the skull through the vertex without contrast.  Comparison: None.  Findings: The brain stem, cerebellum, cerebral peduncles, thalami, basal ganglia, basilar cisterns, and ventricular system appear unremarkable.  Periventricular and corona radiata white matter hypodensities are most compatible with chronic ischemic microvascular white matter disease.  Focal hypodensity medially in the left periventricular white matter is most compatible with a remote lacunar infarct.  No intracranial hemorrhage, mass lesion, or acute infarction is identified.  There is acute-on-chronic bilateral maxillary sinusitis along with opacification of the majority of the ethmoid air cells.  Mucosal thickening in the nasal cavity is present along with acute sphenoid sinusitis.  Bilateral  frontal sinusitis is present.  IMPRESSION:  1. Periventricular and corona radiata white matter hypodensities are most compatible with chronic ischemic microvascular white matter disease. 2.  Focal hypodensity in the left periventricular white matter likely represents remote lacunar infarct. 3.  Acute on chronic paranasal sinusitis.  Original Report Authenticated By: Dellia Cloud, M.D.    Discharge Exam: Blood pressure 160/78, pulse 61, temperature 97.1 F (36.2 C), temperature source Oral, resp. rate 20, height 6' (1.829 m), weight 101.2 kg (223 lb 1.7 oz), SpO2 97.00%. General appearance: alert, cooperative, appears stated age and no distress Head: Normocephalic, without obvious abnormality, atraumatic Resp: Mostly clear to auscultation. Few wheezes were appreciated, but significantly improved from yesterday Cardio: regular rate and rhythm, S1, S2 normal, no murmur, click, rub or gallop GI: soft, non-tender; bowel sounds normal; no masses,  no organomegaly Male genitalia: Examination of the glans reveals erythematous, shallow ulceration which was tender Neurologic: Grossly normal  Disposition:   Discharge Orders    Future Orders Please Complete By Expires   Diet Carb Modified      Increase activity slowly      Discharge instructions      Comments:   Seek attention immediately if you develop fever or worsening SOB or chest pain     Current Discharge Medication List    START taking these medications   Details  amLODipine (NORVASC) 10 MG tablet Take 1 tablet (  10 mg total) by mouth daily. Qty: 30 tablet, Refills: 2    clotrimazole (LOTRIMIN) 1 % cream Apply topically 2 (two) times daily. To glans penis Qty: 30 g, Refills: 0    lisinopril (PRINIVIL,ZESTRIL) 20 MG tablet Take 1 tablet (20 mg total) by mouth daily. Qty: 30 tablet, Refills: 2    moxifloxacin (AVELOX) 400 MG tablet Take 1 tablet (400 mg total) by mouth daily at 6 PM. For 4 days Qty: 4 tablet, Refills: 0      predniSONE (DELTASONE) 20 MG tablet 2 tabs daily for 4 days then 1 tab daily for 4 days, then stop Qty: 12 tablet, Refills: 0      CONTINUE these medications which have NOT CHANGED   Details  albuterol-ipratropium (COMBIVENT) 18-103 MCG/ACT inhaler Inhale 1 puff into the lungs 2 (two) times daily. WHEEZING    aspirin 81 MG chewable tablet Chew 81 mg by mouth daily.    atorvastatin (LIPITOR) 80 MG tablet Take 40 mg by mouth daily. Pt takes 1/2 tab for 80 mg    cholecalciferol (VITAMIN D) 1000 UNITS tablet Take 1,000 Units by mouth daily.    docusate sodium (COLACE) 100 MG capsule Take 100 mg by mouth 2 (two) times daily.    donepezil (ARICEPT) 10 MG tablet Take 10 mg by mouth at bedtime as needed.    famotidine (PEPCID) 20 MG tablet Take 20 mg by mouth 2 (two) times daily.    Fluticasone-Salmeterol (ADVAIR) 100-50 MCG/DOSE AEPB Inhale 1 puff into the lungs every 12 (twelve) hours.    glimepiride (AMARYL) 1 MG tablet Take 1 mg by mouth daily before breakfast.    HYDROcodone-acetaminophen (LORTAB) 10-500 MG per tablet Take 1 tablet by mouth 2 (two) times daily.    magnesium hydroxide (MILK OF MAGNESIA) 400 MG/5ML suspension Take 30-60 mLs by mouth every 7 (seven) days. AS NEEDED FOR CONSTIPATION    Menthol, Topical Analgesic, (BIOFREEZE) 4 % GEL Apply 1 application topically 4 (four) times daily as needed. PAIN    polyethylene glycol (MIRALAX / GLYCOLAX) packet Take 17 g by mouth daily.    Sennosides-Docusate Sodium (SENOKOT S PO) Take 1 tablet by mouth daily.    sildenafil (VIAGRA) 100 MG tablet Take 50 mg by mouth daily as needed.      STOP taking these medications     ketoconazole (NIZORAL) 2 % cream      lisinopril-hydrochlorothiazide (PRINZIDE,ZESTORETIC) 20-25 MG per tablet          Total Discharge Time: 35 mins  [facility]  [facility] Pager [phone]  01/16/2012, 11:13 AM

## 2012-01-16 NOTE — Progress Notes (Signed)
Paged Dr. Elisabeth Pigeon and notified her of pts BP of 183/85 and HR of 93. Pt denies any pain at this time. Order given for metoprolol 50mg  x1 dose.

## 2012-01-18 LAB — CULTURE, BLOOD (ROUTINE X 2): Culture  Setup Time: 201302250144

## 2012-06-23 ENCOUNTER — Encounter (HOSPITAL_COMMUNITY): Payer: Self-pay | Admitting: Emergency Medicine

## 2012-06-23 ENCOUNTER — Emergency Department (HOSPITAL_COMMUNITY)
Admission: EM | Admit: 2012-06-23 | Discharge: 2012-06-23 | Disposition: A | Discharge status: Home / Self Care | Payer: Medicare (Managed Care) | Attending: Emergency Medicine | Admitting: Emergency Medicine

## 2012-06-23 ENCOUNTER — Emergency Department (HOSPITAL_COMMUNITY): Payer: Medicare (Managed Care)

## 2012-06-23 DIAGNOSIS — I10 Essential (primary) hypertension: Secondary | ICD-10-CM | POA: Insufficient documentation

## 2012-06-23 DIAGNOSIS — Z79899 Other long term (current) drug therapy: Secondary | ICD-10-CM | POA: Insufficient documentation

## 2012-06-23 DIAGNOSIS — K859 Acute pancreatitis without necrosis or infection, unspecified: Secondary | ICD-10-CM

## 2012-06-23 DIAGNOSIS — R109 Unspecified abdominal pain: Secondary | ICD-10-CM | POA: Insufficient documentation

## 2012-06-23 LAB — URINALYSIS, ROUTINE W REFLEX MICROSCOPIC
Ketones, ur: NEGATIVE mg/dL
Leukocytes, UA: NEGATIVE
Protein, ur: NEGATIVE mg/dL
Urobilinogen, UA: 0.2 mg/dL (ref 0.0–1.0)

## 2012-06-23 LAB — HEPATIC FUNCTION PANEL
ALT: 14 U/L (ref 0–53)
Alkaline Phosphatase: 50 U/L (ref 39–117)
Bilirubin, Direct: <0.1 mg/dL (ref 0.0–0.3)
Total Protein: 7.5 g/dL (ref 6.0–8.3)

## 2012-06-23 LAB — CBC WITH DIFFERENTIAL/PLATELET
Basophils Absolute: 0 10*3/uL (ref 0.0–0.1)
Basophils Relative: 1 % (ref 0–1)
Eosinophils Absolute: 0.3 10*3/uL (ref 0.0–0.7)
Eosinophils Relative: 5 % (ref 0–5)
HCT: 41.7 % (ref 39.0–52.0)
MCH: 29.1 pg (ref 26.0–34.0)
MCHC: 32.4 g/dL (ref 30.0–36.0)
MCV: 89.9 fL (ref 78.0–100.0)
Monocytes Absolute: 0.5 10*3/uL (ref 0.1–1.0)
Platelets: 168 10*3/uL (ref 150–400)
RDW: 13.9 % (ref 11.5–15.5)
WBC: 6.9 10*3/uL (ref 4.0–10.5)

## 2012-06-23 LAB — BASIC METABOLIC PANEL
Calcium: 9.1 mg/dL (ref 8.4–10.5)
Creatinine, Ser: 1.32 mg/dL (ref 0.50–1.35)
GFR calc non Af Amer: 47 mL/min — ABNORMAL LOW (ref >90)
Sodium: 141 mEq/L (ref 135–145)

## 2012-06-23 LAB — LIPASE, BLOOD: Lipase: 156 U/L — ABNORMAL HIGH (ref 11–59)

## 2012-06-23 MED ORDER — IOHEXOL 300 MG/ML  SOLN
100.0000 mL | Freq: Once | INTRAMUSCULAR | Status: AC | PRN
  Administered 2012-06-23: 100 mL via INTRAVENOUS

## 2012-06-23 MED ORDER — MORPHINE SULFATE 4 MG/ML IJ SOLN
4.0000 mg | Freq: Once | INTRAMUSCULAR | Status: AC
  Administered 2012-06-23: 4 mg via INTRAVENOUS
  Filled 2012-06-23: qty 1

## 2012-06-23 MED ORDER — ONDANSETRON HCL 4 MG/2ML IJ SOLN
4.0000 mg | Freq: Once | INTRAMUSCULAR | Status: AC
  Administered 2012-06-23: 4 mg via INTRAVENOUS
  Filled 2012-06-23: qty 2

## 2012-06-23 MED ORDER — HYDROCODONE-ACETAMINOPHEN 5-500 MG PO TABS: 1.0000 | ORAL_TABLET | Freq: Four times a day (QID) | ORAL | Status: AC | PRN

## 2012-06-23 MED ORDER — ONDANSETRON 8 MG PO TBDP: 8.0000 mg | ORAL_TABLET | Freq: Three times a day (TID) | ORAL | Status: AC | PRN

## 2012-06-23 NOTE — ED Notes (Signed)
Pt c/o right lower abd to flank pain starting this am; pt denies N/V/D

## 2012-06-23 NOTE — ED Notes (Signed)
CT notified that pt is done with oral contrast

## 2012-06-23 NOTE — ED Notes (Signed)
MD at bedside.-Dr. Knapp 

## 2012-06-23 NOTE — ED Provider Notes (Cosign Needed)
A pleasant elderly male who states he started having right flank pain during the night. He denies any trauma or falling or change in his activity. He denies having the pain before. He denies nausea or vomiting. He states the pain is an aching pain. He states it hurts more with certain movements. Patient has had a cholecystectomy and patient had a CT scan of his abdomen/pelvis in February which showed no renal stones and no abdominal aortic aneurysm. He has never had this pain before.    Indicates he has pain in his right flank area however it does not appear to be painful to palpation.  Medical screening examination/treatment/procedure(s) were conducted as a shared visit with non-physician practitioner(s) and myself.  I personally evaluated the patient during the encounter  Devoria Albe, MD, Franz Dell, MD 06/23/12 [phone]

## 2012-06-23 NOTE — ED Notes (Signed)
Pt. Given Happy Meal and ice water.

## 2012-06-23 NOTE — ED Notes (Signed)
Prescription X2 given with discharge instructions. 

## 2012-06-23 NOTE — ED Provider Notes (Addendum)
History     CSN: [id number]  Arrival date & time 06/23/12  1210   First MD Initiated Contact with Patient 06/23/12 1530      Chief Complaint  Patient presents with  . Abdominal Pain  . Flank Pain    (Consider location/radiation/quality/duration/timing/severity/associated sxs/prior treatment) Patient is a 78 y.o. male presenting with abdominal pain. The history is provided by the patient.  Abdominal Pain The primary symptoms of the illness include abdominal pain. The primary symptoms of the illness do not include fever, fatigue, shortness of breath, nausea, vomiting or dysuria. The current episode started yesterday. The onset of the illness was sudden. The problem has not changed since onset. The patient states that she believes she is currently not pregnant. Additional symptoms associated with the illness include back pain. Symptoms associated with the illness do not include chills, anorexia, constipation, urgency, hematuria or frequency.  Pt denies n/v/d. No hx of the same pain. Deneis fever, chills, malaise. Nothing makes pain worse, tender to palpation.  Past Medical History  Diagnosis Date  . COPD (chronic obstructive pulmonary disease)   . HTN (hypertension)   . Hyperlipidemia   . CKD (chronic kidney disease)   . Former smoker, stopped smoking many years ago     Past Surgical History  Procedure Date  . Cholecystectomy     History reviewed. No pertinent family history.  History  Substance Use Topics  . Smoking status: Former Games developer  . Smokeless tobacco: Not on file  . Alcohol Use: Not on file      Review of Systems  Constitutional: Negative for fever, chills and fatigue.  Respiratory: Negative for shortness of breath.   Cardiovascular: Negative.   Gastrointestinal: Positive for abdominal pain. Negative for nausea, vomiting, constipation, blood in stool and anorexia.  Genitourinary: Positive for flank pain. Negative for dysuria, urgency, frequency, hematuria and  discharge.  Musculoskeletal: Positive for back pain.  Skin: Negative.   Neurological: Negative for dizziness, numbness and headaches.  Hematological: Negative.   Psychiatric/Behavioral: Negative.     Allergies  Review of patient's allergies indicates no known allergies.  Home Medications   Current Outpatient Rx  Name Route Sig Dispense Refill  . ALBUTEROL SULFATE (2.5 MG/3ML) 0.083% IN NEBU Nebulization Take 2.5 mg by nebulization every 6 (six) hours as needed. For wheezing    . IPRATROPIUM-ALBUTEROL 18-103 MCG/ACT IN AERO Inhalation Inhale 1 puff into the lungs 2 (two) times daily. WHEEZING    . AMLODIPINE BESYLATE 10 MG PO TABS Oral Take 1 tablet (10 mg total) by mouth daily. 30 tablet 2  . ASPIRIN 81 MG PO CHEW Oral Chew 81 mg by mouth daily.    . ATORVASTATIN CALCIUM 80 MG PO TABS Oral Take 40 mg by mouth daily. Pt takes 1/2 tab for 80 mg    . VITAMIN D 1000 UNITS PO TABS Oral Take 1,000 Units by mouth daily.    Marland Kitchen CLOTRIMAZOLE 1 % EX CREA Topical Apply topically 2 (two) times daily. To glans penis 30 g 0  . DOCUSATE SODIUM 100 MG PO CAPS Oral Take 100 mg by mouth 2 (two) times daily.    . DONEPEZIL HCL 10 MG PO TABS Oral Take 10 mg by mouth at bedtime as needed.    Marland Kitchen FAMOTIDINE 20 MG PO TABS Oral Take 20 mg by mouth 2 (two) times daily.    Marland Kitchen FLUTICASONE-SALMETEROL 100-50 MCG/DOSE IN AEPB Inhalation Inhale 1 puff into the lungs every 12 (twelve) hours.    Marland Kitchen GLIMEPIRIDE  1 MG PO TABS Oral Take 1 mg by mouth daily before breakfast.    . LISINOPRIL 20 MG PO TABS Oral Take 1 tablet (20 mg total) by mouth daily. 30 tablet 2  . MAGNESIUM HYDROXIDE 400 MG/5ML PO SUSP Oral Take 30-60 mLs by mouth every 7 (seven) days. AS NEEDED FOR CONSTIPATION    . MENTHOL (TOPICAL ANALGESIC) 4 % EX GEL Apply externally Apply 1 application topically 4 (four) times daily as needed. PAIN    . MORPHINE SULFATE ER 15 MG PO TBCR Oral Take 15 mg by mouth daily.    Marland Kitchen POLYETHYLENE GLYCOL 3350 PO PACK Oral Take 17 g  by mouth daily.    Marland Kitchen PREDNISONE 20 MG PO TABS  2 tabs daily for 4 days then 1 tab daily for 4 days, then stop 12 tablet 0  . SENOKOT S PO Oral Take 1 tablet by mouth daily.    Marland Kitchen SILDENAFIL CITRATE 100 MG PO TABS Oral Take 50 mg by mouth daily as needed.      BP 148/80  Pulse 92  Temp 97.3 F (36.3 C) (Oral)  Resp 20  SpO2 99%  Physical Exam  Nursing note and vitals reviewed. Constitutional: He is oriented to person, place, and time. He appears well-developed and well-nourished. No distress.  HENT:  Head: Normocephalic.  Eyes: Conjunctivae normal are normal.  Neck: Neck supple.  Cardiovascular: Normal rate, regular rhythm and normal heart sounds.   Pulmonary/Chest: Effort normal and breath sounds normal. No respiratory distress. He has no wheezes. He has no rales.  Abdominal: Soft. Bowel sounds are normal. There is no rebound and no guarding.       RUQ, RLQ tenderness. No CVA tenderness  Musculoskeletal: He exhibits no edema.  Neurological: He is alert and oriented to person, place, and time.  Skin: Skin is warm and dry.  Psychiatric: He has a normal mood and affect.    ED Course  Procedures (including critical care time)  Pt with right flank pain. No other complaints. Will get labs, pain meds ordered.   Results for orders placed during the hospital encounter of 06/23/12  CBC WITH DIFFERENTIAL      Component Value Range   WBC 6.9  4.0 - 10.5 K/uL   RBC 4.64  4.22 - 5.81 MIL/uL   Hemoglobin 13.5  13.0 - 17.0 g/dL   HCT 40.9  81.1 - 91.4 %   MCV 89.9  78.0 - 100.0 fL   MCH 29.1  26.0 - 34.0 pg   MCHC 32.4  30.0 - 36.0 g/dL   RDW 78.2  95.6 - 21.3 %   Platelets 168  150 - 400 K/uL   Neutrophils Relative 40 (*) 43 - 77 %   Neutro Abs 2.8  1.7 - 7.7 K/uL   Lymphocytes Relative 48 (*) 12 - 46 %   Lymphs Abs 3.3  0.7 - 4.0 K/uL   Monocytes Relative 7  3 - 12 %   Monocytes Absolute 0.5  0.1 - 1.0 K/uL   Eosinophils Relative 5  0 - 5 %   Eosinophils Absolute 0.3  0.0 - 0.7  K/uL   Basophils Relative 1  0 - 1 %   Basophils Absolute 0.0  0.0 - 0.1 K/uL  BASIC METABOLIC PANEL      Component Value Range   Sodium 141  135 - 145 mEq/L   Potassium 3.7  3.5 - 5.1 mEq/L   Chloride 103  96 - 112 mEq/L  CO2 28  19 - 32 mEq/L   Glucose, Bld 93  70 - 99 mg/dL   BUN 19  6 - 23 mg/dL   Creatinine, Ser 8.29  0.50 - 1.35 mg/dL   Calcium 9.1  8.4 - 56.2 mg/dL   GFR calc non Af Amer 47 (*) >90 mL/min   GFR calc Af Amer 55 (*) >90 mL/min  URINALYSIS, ROUTINE W REFLEX MICROSCOPIC      Component Value Range   Color, Urine YELLOW  YELLOW   APPearance CLEAR  CLEAR   Specific Gravity, Urine 1.018  1.005 - 1.030   pH 5.5  5.0 - 8.0   Glucose, UA NEGATIVE  NEGATIVE mg/dL   Hgb urine dipstick NEGATIVE  NEGATIVE   Bilirubin Urine NEGATIVE  NEGATIVE   Ketones, ur NEGATIVE  NEGATIVE mg/dL   Protein, ur NEGATIVE  NEGATIVE mg/dL   Urobilinogen, UA 0.2  0.0 - 1.0 mg/dL   Nitrite NEGATIVE  NEGATIVE   Leukocytes, UA NEGATIVE  NEGATIVE  HEPATIC FUNCTION PANEL      Component Value Range   Total Protein 7.5  6.0 - 8.3 g/dL   Albumin 3.5  3.5 - 5.2 g/dL   AST 17  0 - 37 U/L   ALT 14  0 - 53 U/L   Alkaline Phosphatase 50  39 - 117 U/L   Total Bilirubin 0.3  0.3 - 1.2 mg/dL   Bilirubin, Direct <1.3  0.0 - 0.3 mg/dL   Indirect Bilirubin NOT CALCULATED  0.3 - 0.9 mg/dL  LIPASE, BLOOD      Component Value Range   Lipase 156 (*) 11 - 59 U/L   Ct Abdomen Pelvis W Contrast  06/23/2012  *RADIOLOGY REPORT*  Clinical Data: Abdominal pain, right flank pain  CT ABDOMEN AND PELVIS WITH CONTRAST  Technique:  Multidetector CT imaging of the abdomen and pelvis was performed following the standard protocol during bolus administration of intravenous contrast.  Contrast: OMNIPAQUE IOHEXOL 300 MG/ML  SOLN  Comparison: 01/09/2010 and 01/15/2012  Findings: Lung bases are unremarkable.  The sagittal images of the spine shows stable osteopenia and degenerative changes thoracolumbar spine.  Enhanced  liver is unremarkable.  Enhanced pancreas, spleen and adrenal glands are unremarkable.  Kidneys are symmetrical in size and enhancement.  Delayed renal images shows bilateral renal symmetrical excretion.  Bilateral visualized proximal ureter is unremarkable.  Again noted severe atheromatous disease within abdominal aorta. Atherosclerotic calcifications bilateral renal artery origin, SMA and celiac trunk origin again noted.  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  Again noted extensive atherosclerotic plaque and calcification with stenosis bilateral common iliac arteries.  Again noted the SMA patent although there is  stenosis of SMA axial image 37.  No small bowel obstruction.  No ascites or free air.  No adenopathy.  There is no pericecal inflammation.  Mild distended urinary bladder.  Prostate gland and seminal vesicles are unremarkable.  Atherosclerotic calcifications of the external iliacs and femoral arteries.  No destructive bony lesions are noted within pelvis.  IMPRESSION:  1.  Again noted severe atheromatous disease in the abdominal aorta with near occlusion of the aorta at 2.5 cm below the level of the renal arteries.  This was noted on prior exam 01/09/2010. 2.  No hydronephrosis or hydroureter. 3.  No small bowel obstruction. 4.  Mild distended urinary bladder.  Original Report Authenticated By: Natasha Mead, M.D.   9:45 PM Pain improved. CT did not  show any significant findings. Lipase mildly elevated at 156, suspect pancreatitis. Pt is feeling better. Tolerating POs, no nausea or vomiting. No Distress. VS normal. Will d/c home with pain medications, anti emetics, clear liquid diet, follow up with his doctor. PT agrees with the plan.   Filed Vitals:   06/23/12 1926  BP: 122/72  Pulse: 64  Temp:   Resp: 18    1. Pancreatitis   2. Flank pain       MDM  Pt with right flank pain, hx of the same. Labs showed elevated lipase of 156,  consistent with pancreatitis. CT abdomen showed no significant findings. Pt feeling better. He was placed on clear liquid diet but I found him finishing a sandwitch in his room. He reports no pain and tolerated sadwitch with no problems. Will start on clear liquids at home, pain medications, anti emetics. Instructed to return if vomiting, increased pain, fever, or any new worrisome findings.         Lottie Mussel, PA 06/24/12 0110  Lottie Mussel, PA 08/25/12 1427

## 2012-06-23 NOTE — ED Notes (Signed)
[facility] [phone] call when patient gets to room

## 2012-06-23 NOTE — ED Notes (Addendum)
Pt moved into room, changed into gown and resting comfortably with call light within reach.  Pt ambulatory with moderate assistance from waiting room to Pod A

## 2012-06-23 NOTE — ED Notes (Signed)
PA at bedside.

## 2012-06-25 NOTE — ED Provider Notes (Signed)
See prior note   Antonieta Slaven L Aislinn Feliz, MD 06/25/12 1623 

## 2012-08-25 NOTE — ED Provider Notes (Signed)
Medical screening examination/treatment/procedure(s) were performed by non-physician practitioner and as supervising physician I was immediately available for consultation/collaboration. Devoria Albe, MD, Armando Gang   Ward Givens, MD 08/25/12 [phone]

## 2012-09-18 ENCOUNTER — Emergency Department (HOSPITAL_COMMUNITY): Payer: Medicare (Managed Care)

## 2012-09-18 ENCOUNTER — Encounter (HOSPITAL_COMMUNITY): Payer: Self-pay

## 2012-09-18 ENCOUNTER — Emergency Department (HOSPITAL_COMMUNITY)
Admission: EM | Admit: 2012-09-18 | Discharge: 2012-09-19 | Disposition: A | Discharge status: Home / Self Care | Payer: Medicare (Managed Care) | Attending: Emergency Medicine | Admitting: Emergency Medicine

## 2012-09-18 DIAGNOSIS — N289 Disorder of kidney and ureter, unspecified: Secondary | ICD-10-CM

## 2012-09-18 DIAGNOSIS — Z7982 Long term (current) use of aspirin: Secondary | ICD-10-CM | POA: Insufficient documentation

## 2012-09-18 DIAGNOSIS — R112 Nausea with vomiting, unspecified: Secondary | ICD-10-CM

## 2012-09-18 DIAGNOSIS — J449 Chronic obstructive pulmonary disease, unspecified: Secondary | ICD-10-CM | POA: Insufficient documentation

## 2012-09-18 DIAGNOSIS — Z87891 Personal history of nicotine dependence: Secondary | ICD-10-CM | POA: Insufficient documentation

## 2012-09-18 DIAGNOSIS — E860 Dehydration: Secondary | ICD-10-CM

## 2012-09-18 DIAGNOSIS — J4489 Other specified chronic obstructive pulmonary disease: Secondary | ICD-10-CM | POA: Insufficient documentation

## 2012-09-18 DIAGNOSIS — Z79899 Other long term (current) drug therapy: Secondary | ICD-10-CM | POA: Insufficient documentation

## 2012-09-18 DIAGNOSIS — I499 Cardiac arrhythmia, unspecified: Secondary | ICD-10-CM | POA: Insufficient documentation

## 2012-09-18 DIAGNOSIS — E785 Hyperlipidemia, unspecified: Secondary | ICD-10-CM | POA: Insufficient documentation

## 2012-09-18 DIAGNOSIS — I129 Hypertensive chronic kidney disease with stage 1 through stage 4 chronic kidney disease, or unspecified chronic kidney disease: Secondary | ICD-10-CM | POA: Insufficient documentation

## 2012-09-18 LAB — CBC WITH DIFFERENTIAL/PLATELET
Basophils Absolute: 0 10*3/uL (ref 0.0–0.1)
Basophils Relative: 1 % (ref 0–1)
Eosinophils Absolute: 0.2 10*3/uL (ref 0.0–0.7)
Hemoglobin: 13.5 g/dL (ref 13.0–17.0)
MCH: 29.5 pg (ref 26.0–34.0)
MCHC: 33.1 g/dL (ref 30.0–36.0)
Monocytes Relative: 11 % (ref 3–12)
Neutro Abs: 2.5 10*3/uL (ref 1.7–7.7)
Neutrophils Relative %: 42 % — ABNORMAL LOW (ref 43–77)
Platelets: 161 10*3/uL (ref 150–400)

## 2012-09-18 LAB — LIPASE, BLOOD: Lipase: 51 U/L (ref 11–59)

## 2012-09-18 LAB — HEPATIC FUNCTION PANEL
Albumin: 3.5 g/dL (ref 3.5–5.2)
Total Protein: 7.5 g/dL (ref 6.0–8.3)

## 2012-09-18 LAB — BASIC METABOLIC PANEL
Chloride: 97 mEq/L (ref 96–112)
GFR calc Af Amer: 38 mL/min — ABNORMAL LOW (ref >90)
GFR calc non Af Amer: 33 mL/min — ABNORMAL LOW (ref >90)
Potassium: 4.6 mEq/L (ref 3.5–5.1)
Sodium: 134 mEq/L — ABNORMAL LOW (ref 135–145)

## 2012-09-18 LAB — POCT I-STAT TROPONIN I: Troponin i, poc: 0.02 ng/mL (ref 0.00–0.08)

## 2012-09-18 MED ORDER — ONDANSETRON HCL 4 MG/2ML IJ SOLN
4.0000 mg | Freq: Once | INTRAMUSCULAR | Status: AC
  Administered 2012-09-18: 4 mg via INTRAVENOUS
  Filled 2012-09-18: qty 2

## 2012-09-18 MED ORDER — SODIUM CHLORIDE 0.9 % IV SOLN: Freq: Once | INTRAVENOUS | Status: DC

## 2012-09-18 MED ORDER — SODIUM CHLORIDE 0.9 % IV BOLUS (SEPSIS)
500.0000 mL | Freq: Once | INTRAVENOUS | Status: AC
  Administered 2012-09-19: 500 mL via INTRAVENOUS

## 2012-09-18 NOTE — ED Notes (Signed)
Yesterday patient has had nausea and vomiting .  No diarrhea, no abdominal pain.  No chest pain, palpitations.

## 2012-09-18 NOTE — ED Provider Notes (Signed)
History     CSN: [id number]  Arrival date & time 09/18/12  2009   First MD Initiated Contact with Patient 09/18/12 2140      Chief Complaint  Patient presents with  . Nausea  . Irregular Heart Beat    Patient is a 78 y.o. male presenting with vomiting. The history is provided by the patient.  Emesis  This is a new problem. The current episode started yesterday. Episode frequency: several episodes. The problem has been gradually improving. The emesis has an appearance of stomach contents. There has been no fever. Pertinent negatives include no abdominal pain, no cough, no diarrhea and no fever. Risk factors: unknown.  pt reports nausea/vomiting that started yesterday No cp/sob.  No abd pain.  No fever No dysuria No focal weakness.  No diaphoresis He reports no BM in past 3 days but does not feel impacted Vomitus is nonbloody Past Medical History  Diagnosis Date  . COPD (chronic obstructive pulmonary disease)   . HTN (hypertension)   . Hyperlipidemia   . CKD (chronic kidney disease)   . Former smoker, stopped smoking many years ago     Past Surgical History  Procedure Date  . Cholecystectomy     History reviewed. No pertinent family history.  History  Substance Use Topics  . Smoking status: Former Games developer  . Smokeless tobacco: Not on file  . Alcohol Use: Not on file      Review of Systems  Constitutional: Negative for fever.  Respiratory: Negative for cough.   Gastrointestinal: Positive for vomiting. Negative for abdominal pain and diarrhea.  All other systems reviewed and are negative.    Allergies  Review of patient's allergies indicates no known allergies.  Home Medications   Current Outpatient Rx  Name Route Sig Dispense Refill  . ALBUTEROL SULFATE (2.5 MG/3ML) 0.083% IN NEBU Nebulization Take 2.5 mg by nebulization every 6 (six) hours as needed. For wheezing    . IPRATROPIUM-ALBUTEROL 18-103 MCG/ACT IN AERO Inhalation Inhale 1 puff into the lungs 2  (two) times daily. WHEEZING    . AMLODIPINE BESYLATE 10 MG PO TABS Oral Take 1 tablet (10 mg total) by mouth daily. 30 tablet 2  . ASPIRIN 81 MG PO CHEW Oral Chew 81 mg by mouth daily.    . ATORVASTATIN CALCIUM 80 MG PO TABS Oral Take 40 mg by mouth daily. Pt takes 1/2 tab for 80 mg    . VITAMIN D 1000 UNITS PO TABS Oral Take 1,000 Units by mouth daily.    Marland Kitchen CLOTRIMAZOLE 1 % EX CREA Topical Apply topically 2 (two) times daily. To glans penis 30 g 0  . DOCUSATE SODIUM 100 MG PO CAPS Oral Take 100 mg by mouth 2 (two) times daily.    . DONEPEZIL HCL 10 MG PO TABS Oral Take 10 mg by mouth at bedtime as needed.    Marland Kitchen FAMOTIDINE 20 MG PO TABS Oral Take 20 mg by mouth 2 (two) times daily.    Marland Kitchen FLUTICASONE-SALMETEROL 100-50 MCG/DOSE IN AEPB Inhalation Inhale 1 puff into the lungs every 12 (twelve) hours.    Marland Kitchen GLIMEPIRIDE 1 MG PO TABS Oral Take 1 mg by mouth daily before breakfast.    . LISINOPRIL 20 MG PO TABS Oral Take 1 tablet (20 mg total) by mouth daily. 30 tablet 2  . MAGNESIUM HYDROXIDE 400 MG/5ML PO SUSP Oral Take 30-60 mLs by mouth every 7 (seven) days. AS NEEDED FOR CONSTIPATION    . MENTHOL (TOPICAL ANALGESIC) 4 %  EX GEL Apply externally Apply 1 application topically 4 (four) times daily as needed. PAIN    . MORPHINE SULFATE ER 15 MG PO TBCR Oral Take 15 mg by mouth daily.    Marland Kitchen POLYETHYLENE GLYCOL 3350 PO PACK Oral Take 17 g by mouth daily.    Marland Kitchen PREDNISONE 20 MG PO TABS  2 tabs daily for 4 days then 1 tab daily for 4 days, then stop 12 tablet 0  . SENOKOT S PO Oral Take 1 tablet by mouth daily.    Marland Kitchen SILDENAFIL CITRATE 100 MG PO TABS Oral Take 50 mg by mouth daily as needed.      BP 129/61  Pulse 90  Temp 98.1 F (36.7 C) (Oral)  Resp 12  SpO2 97% BP 134/64  Pulse 84  Temp 98.1 F (36.7 C) (Oral)  Resp 25  SpO2 98%  Physical Exam CONSTITUTIONAL: Well developed/well nourished HEAD AND FACE: Normocephalic/atraumatic EYES: EOMI/PERRL, no icterus ENMT: Mucous membranes moist NECK:  supple no meningeal signs SPINE:entire spine nontender CV: S1/S2 noted, no murmurs/rubs/gallops noted LUNGS: Lungs are clear to auscultation bilaterally, no apparent distress ABDOMEN: soft, nontender, no rebound or guarding, +BS GU:no cva tenderness, no hernia noted, no testicular tenderness - chaperone present Rectal - stool color normal, no blood noted, no melena, no stool impaction NEURO: Pt is awake/alert, moves all extremitiesx4 EXTREMITIES: pulses normal, full ROM SKIN: warm, color normal PSYCH: no abnormalities of mood noted  ED Course  Procedures    Labs Reviewed  CBC WITH DIFFERENTIAL  BASIC METABOLIC PANEL   84:69 PM Imaging/labs ordered EKG unremarkable 11:34 PM Pt feeling improved.  No vomiting.   Labs thus reassuring and vitals apprpriate Abdomen soft and he denies abd pain and back pain No syncope reported    MDM  Nursing notes including past medical history and social history reviewed and considered in documentation xrays reviewed and considered Labs/vital reviewed and considered        Date: 09/18/2012  Rate: 93  Rhythm: normal sinus rhythm  QRS Axis: left  Intervals: normal  ST/T Wave abnormalities: nonspecific ST changes  Conduction Disutrbances:none  Narrative Interpretation:   Old EKG Reviewed: unchanged  prehospital EKG reviewed and no acute ST changes  Joya Gaskins, MD 09/19/12 0003

## 2012-09-18 NOTE — ED Notes (Signed)
Patient transported to X-ray 

## 2012-09-19 MED ORDER — METHYLPREDNISOLONE SODIUM SUCC 125 MG IJ SOLR
INTRAMUSCULAR | Status: AC
  Filled 2012-09-19: qty 2

## 2012-10-19 ENCOUNTER — Emergency Department (HOSPITAL_COMMUNITY)
Admission: EM | Admit: 2012-10-19 | Discharge: 2012-10-19 | Disposition: A | Discharge status: Home / Self Care | Payer: Medicare (Managed Care) | Attending: Emergency Medicine | Admitting: Emergency Medicine

## 2012-10-19 ENCOUNTER — Emergency Department (HOSPITAL_COMMUNITY): Payer: Medicare (Managed Care)

## 2012-10-19 ENCOUNTER — Encounter (HOSPITAL_COMMUNITY): Payer: Self-pay | Admitting: *Deleted

## 2012-10-19 DIAGNOSIS — Z79899 Other long term (current) drug therapy: Secondary | ICD-10-CM | POA: Insufficient documentation

## 2012-10-19 DIAGNOSIS — R0989 Other specified symptoms and signs involving the circulatory and respiratory systems: Secondary | ICD-10-CM | POA: Insufficient documentation

## 2012-10-19 DIAGNOSIS — I129 Hypertensive chronic kidney disease with stage 1 through stage 4 chronic kidney disease, or unspecified chronic kidney disease: Secondary | ICD-10-CM | POA: Insufficient documentation

## 2012-10-19 DIAGNOSIS — R0609 Other forms of dyspnea: Secondary | ICD-10-CM | POA: Insufficient documentation

## 2012-10-19 DIAGNOSIS — Z87891 Personal history of nicotine dependence: Secondary | ICD-10-CM | POA: Insufficient documentation

## 2012-10-19 DIAGNOSIS — E785 Hyperlipidemia, unspecified: Secondary | ICD-10-CM | POA: Insufficient documentation

## 2012-10-19 DIAGNOSIS — J441 Chronic obstructive pulmonary disease with (acute) exacerbation: Secondary | ICD-10-CM | POA: Insufficient documentation

## 2012-10-19 DIAGNOSIS — N185 Chronic kidney disease, stage 5: Secondary | ICD-10-CM | POA: Insufficient documentation

## 2012-10-19 LAB — CBC
MCH: 28.4 pg (ref 26.0–34.0)
MCHC: 32.4 g/dL (ref 30.0–36.0)
Platelets: 181 10*3/uL (ref 150–400)
RDW: 13.5 % (ref 11.5–15.5)

## 2012-10-19 LAB — BASIC METABOLIC PANEL
BUN: 29 mg/dL — ABNORMAL HIGH (ref 6–23)
Calcium: 9.1 mg/dL (ref 8.4–10.5)
Creatinine, Ser: 1.41 mg/dL — ABNORMAL HIGH (ref 0.50–1.35)
GFR calc non Af Amer: 44 mL/min — ABNORMAL LOW (ref >90)
Glucose, Bld: 84 mg/dL (ref 70–99)
Sodium: 137 mEq/L (ref 135–145)

## 2012-10-19 LAB — POCT I-STAT TROPONIN I: Troponin i, poc: 0.01 ng/mL (ref 0.00–0.08)

## 2012-10-19 MED ORDER — IPRATROPIUM BROMIDE 0.02 % IN SOLN
0.5000 mg | Freq: Once | RESPIRATORY_TRACT | Status: AC
  Administered 2012-10-19: 0.5 mg via RESPIRATORY_TRACT
  Filled 2012-10-19: qty 5

## 2012-10-19 MED ORDER — ALBUTEROL SULFATE (5 MG/ML) 0.5% IN NEBU
2.5000 mg | INHALATION_SOLUTION | Freq: Once | RESPIRATORY_TRACT | Status: AC
  Administered 2012-10-19: 2.5 mg via RESPIRATORY_TRACT
  Filled 2012-10-19: qty 0.5

## 2012-10-19 MED ORDER — METHYLPREDNISOLONE SODIUM SUCC 125 MG IJ SOLR
125.0000 mg | Freq: Once | INTRAMUSCULAR | Status: AC
  Administered 2012-10-19: 125 mg via INTRAVENOUS
  Filled 2012-10-19: qty 2

## 2012-10-19 MED ORDER — PREDNISONE 50 MG PO TABS: 50.0000 mg | ORAL_TABLET | Freq: Every day | ORAL | Status: DC

## 2012-10-19 NOTE — ED Notes (Signed)
Attempted to call pt's daughter for transportation home. One number (found on pt's file) out of service. Number pt gave me did not answer phone, no voicemail set up

## 2012-10-19 NOTE — ED Notes (Signed)
[id number]<AV> Expected date:<BR> Expected time:<BR> Means of arrival:Ambulance<BR> Comments:<BR> SOB

## 2012-10-19 NOTE — ED Notes (Signed)
Spoke to pt's daughter.  She told [facility] that she will be coming to pick pt up

## 2012-10-19 NOTE — ED Provider Notes (Addendum)
History     CSN: [id number]  Arrival date & time 10/19/12  1138   First MD Initiated Contact with Patient 10/19/12 1312      Chief Complaint  Patient presents with  . Shortness of Breath    (Consider location/radiation/quality/duration/timing/severity/associated sxs/prior treatment) Patient is a 78 y.o. male presenting with shortness of breath. The history is provided by the patient and the nursing home.  Shortness of Breath  Associated symptoms include shortness of breath.  He is a somewhat poor and evasive historian, but he apparently has been having increased dyspnea over the last 4 days. He relates dyspnea with mild exertion, but no orthopnea. Denies chest pain or cough. Denies fever or chills. He is not aware of any treatment having been given for his dyspnea. He states he really doesn't feel that that, but the nurses at the nursing home wanted him evaluated.  Past Medical History  Diagnosis Date  . COPD (chronic obstructive pulmonary disease)   . HTN (hypertension)   . Hyperlipidemia   . CKD (chronic kidney disease)   . Former smoker, stopped smoking many years ago     Past Surgical History  Procedure Date  . Cholecystectomy     No family history on file.  History  Substance Use Topics  . Smoking status: Former Games developer  . Smokeless tobacco: Not on file  . Alcohol Use: No      Review of Systems  Respiratory: Positive for shortness of breath.   All other systems reviewed and are negative.    Allergies  Review of patient's allergies indicates no known allergies.  Home Medications   Current Outpatient Rx  Name  Route  Sig  Dispense  Refill  . ALBUTEROL SULFATE (2.5 MG/3ML) 0.083% IN NEBU   Nebulization   Take 2.5 mg by nebulization every 6 (six) hours as needed. For wheezing         . IPRATROPIUM-ALBUTEROL 18-103 MCG/ACT IN AERO   Inhalation   Inhale 1 puff into the lungs 2 (two) times daily. WHEEZING         . AMLODIPINE BESYLATE 10 MG PO TABS   Oral   Take 1 tablet (10 mg total) by mouth daily.   30 tablet   2   . ASPIRIN 81 MG PO CHEW   Oral   Chew 81 mg by mouth daily.         . ATORVASTATIN CALCIUM 80 MG PO TABS   Oral   Take 40 mg by mouth daily. Pt takes 1/2 tab for 80 mg         . VITAMIN D 1000 UNITS PO TABS   Oral   Take 1,000 Units by mouth daily.         Marland Kitchen CLOTRIMAZOLE 1 % EX CREA   Topical   Apply topically 2 (two) times daily. To glans penis   30 g   0   . DOCUSATE SODIUM 100 MG PO CAPS   Oral   Take 100 mg by mouth 2 (two) times daily.         . DONEPEZIL HCL 10 MG PO TABS   Oral   Take 10 mg by mouth at bedtime as needed.         Marland Kitchen FAMOTIDINE 20 MG PO TABS   Oral   Take 20 mg by mouth 2 (two) times daily.         Marland Kitchen FLUTICASONE-SALMETEROL 100-50 MCG/DOSE IN AEPB   Inhalation   Inhale 1  puff into the lungs every 12 (twelve) hours.         Marland Kitchen GLIMEPIRIDE 1 MG PO TABS   Oral   Take 1 mg by mouth daily before breakfast.         . LISINOPRIL 20 MG PO TABS   Oral   Take 1 tablet (20 mg total) by mouth daily.   30 tablet   2   . MAGNESIUM HYDROXIDE 400 MG/5ML PO SUSP   Oral   Take 30-60 mLs by mouth every 7 (seven) days. AS NEEDED FOR CONSTIPATION         . MENTHOL (TOPICAL ANALGESIC) 4 % EX GEL   Apply externally   Apply 1 application topically 4 (four) times daily as needed. PAIN         . MORPHINE SULFATE ER 15 MG PO TBCR   Oral   Take 15 mg by mouth daily.         Marland Kitchen POLYETHYLENE GLYCOL 3350 PO PACK   Oral   Take 17 g by mouth daily.         Marland Kitchen PREDNISONE 20 MG PO TABS      2 tabs daily for 4 days then 1 tab daily for 4 days, then stop   12 tablet   0   . SENOKOT S PO   Oral   Take 1 tablet by mouth daily.         Marland Kitchen SILDENAFIL CITRATE 100 MG PO TABS   Oral   Take 50 mg by mouth daily as needed.           BP 135/68  Pulse 94  Temp 98.1 F (36.7 C) (Oral)  Resp 20  SpO2 97%  Physical Exam  Nursing note and vitals reviewed. 78 year old  male, resting comfortably and in no acute distress. Vital signs are normal. Oxygen saturation is 97%, which is normal. Head is normocephalic and atraumatic. PERRLA, EOMI. Oropharynx is clear. Neck is nontender and supple without adenopathy or JVD. Back is nontender and there is no CVA tenderness. Lungs are clear without rales, wheezes, or rhonchi. There is slight prolonged exhalation phase. Chest is nontender. Heart has regular rate and rhythm without murmur. Abdomen is soft, flat, nontender without masses or hepatosplenomegaly and peristalsis is normoactive. Extremities have no cyanosis or edema, full range of motion is present. Skin is warm and dry without rash. Neurologic: Mental status is normal, cranial nerves are intact, there are no motor or sensory deficits.   ED Course  Procedures (including critical care time)  Results for orders placed during the hospital encounter of 10/19/12  BASIC METABOLIC PANEL      Component Value Range   Sodium 137  135 - 145 mEq/L   Potassium 3.4 (*) 3.5 - 5.1 mEq/L   Chloride 102  96 - 112 mEq/L   CO2 20  19 - 32 mEq/L   Glucose, Bld 84  70 - 99 mg/dL   BUN 29 (*) 6 - 23 mg/dL   Creatinine, Ser 5.62 (*) 0.50 - 1.35 mg/dL   Calcium 9.1  8.4 - 13.0 mg/dL   GFR calc non Af Amer 44 (*) >90 mL/min   GFR calc Af Amer 51 (*) >90 mL/min  CBC      Component Value Range   WBC 5.0  4.0 - 10.5 K/uL   RBC 4.97  4.22 - 5.81 MIL/uL   Hemoglobin 14.1  13.0 - 17.0 g/dL   HCT 86.5  78.4 -  52.0 %   MCV 87.5  78.0 - 100.0 fL   MCH 28.4  26.0 - 34.0 pg   MCHC 32.4  30.0 - 36.0 g/dL   RDW 16.1  09.6 - 04.5 %   Platelets 181  150 - 400 K/uL  POCT I-STAT TROPONIN I      Component Value Range   Troponin i, poc 0.01  0.00 - 0.08 ng/mL   Comment 3           TROPONIN I      Component Value Range   Troponin I <0.30  <0.30 ng/mL  PRO B NATRIURETIC PEPTIDE      Component Value Range   Pro B Natriuretic peptide (BNP) 43.9  0 - 450 pg/mL   Dg Chest Portable 1  View  10/19/2012  *RADIOLOGY REPORT*  Clinical Data: Shortness of breath.  PORTABLE CHEST - 1 VIEW  Comparison: 09/18/2012  Findings: Heart and mediastinal contours are within normal limits. No focal opacities or effusions.  No acute bony abnormality.  IMPRESSION: No active cardiopulmonary disease.   Original Report Authenticated By: Charlett Nose, M.D.     Date: 10/19/2012  Rate: 86  Rhythm: normal sinus rhythm  QRS Axis: left  Intervals: normal  ST/T Wave abnormalities: normal  Conduction Disutrbances:left anterior fascicular block  Narrative Interpretation: Left axis deviation with left anterior fascicular block. When compared with ECG of 01/12/2012, first degree AV block has resolved, left anterior fascicular block is now present.  Old EKG Reviewed: changes noted     1. COPD exacerbation       MDM  Probable exacerbation of COPD. Chest x-ray or be obtained to rule out pneumonia. ECG shows no acute changes, but cardiac marker will be checked.  Cardiac markers and BNP are negative. He feels better after albuterol with Atrovent. He is given a dose of methylprednisolone and will be returned to the nursing home with a prescription for prednisone for 5 days.      Dione Booze, MD 10/19/12 1421  Dione Booze, MD 10/19/12 [phone]

## 2012-10-19 NOTE — ED Notes (Signed)
Per ems: pt from home, reports home nurse calling, c/o SOB x4 days. Wheezing noted in all lung fields. saO2 100% on 4L [place]. bp 144/104, pulse 90.

## 2012-10-19 NOTE — ED Notes (Signed)
Pt/family verbalizes understanding

## 2013-03-29 ENCOUNTER — Emergency Department (HOSPITAL_COMMUNITY)
Admission: EM | Admit: 2013-03-29 | Discharge: 2013-03-30 | Disposition: A | Discharge status: Home / Self Care | Payer: Medicare (Managed Care) | Attending: Emergency Medicine | Admitting: Emergency Medicine

## 2013-03-29 ENCOUNTER — Encounter (HOSPITAL_COMMUNITY): Payer: Self-pay

## 2013-03-29 ENCOUNTER — Emergency Department (HOSPITAL_COMMUNITY): Payer: Medicare (Managed Care)

## 2013-03-29 DIAGNOSIS — J4489 Other specified chronic obstructive pulmonary disease: Secondary | ICD-10-CM | POA: Insufficient documentation

## 2013-03-29 DIAGNOSIS — Z7982 Long term (current) use of aspirin: Secondary | ICD-10-CM | POA: Insufficient documentation

## 2013-03-29 DIAGNOSIS — I10 Essential (primary) hypertension: Secondary | ICD-10-CM | POA: Insufficient documentation

## 2013-03-29 DIAGNOSIS — E785 Hyperlipidemia, unspecified: Secondary | ICD-10-CM | POA: Insufficient documentation

## 2013-03-29 DIAGNOSIS — R404 Transient alteration of awareness: Secondary | ICD-10-CM | POA: Insufficient documentation

## 2013-03-29 DIAGNOSIS — R4189 Other symptoms and signs involving cognitive functions and awareness: Secondary | ICD-10-CM

## 2013-03-29 DIAGNOSIS — R609 Edema, unspecified: Secondary | ICD-10-CM | POA: Insufficient documentation

## 2013-03-29 DIAGNOSIS — IMO0002 Reserved for concepts with insufficient information to code with codable children: Secondary | ICD-10-CM | POA: Insufficient documentation

## 2013-03-29 DIAGNOSIS — Z87891 Personal history of nicotine dependence: Secondary | ICD-10-CM | POA: Insufficient documentation

## 2013-03-29 DIAGNOSIS — N189 Chronic kidney disease, unspecified: Secondary | ICD-10-CM | POA: Insufficient documentation

## 2013-03-29 DIAGNOSIS — J449 Chronic obstructive pulmonary disease, unspecified: Secondary | ICD-10-CM | POA: Insufficient documentation

## 2013-03-29 DIAGNOSIS — Z79899 Other long term (current) drug therapy: Secondary | ICD-10-CM | POA: Insufficient documentation

## 2013-03-29 LAB — URINALYSIS, ROUTINE W REFLEX MICROSCOPIC
Bilirubin Urine: NEGATIVE
Glucose, UA: NEGATIVE mg/dL
Ketones, ur: NEGATIVE mg/dL
Leukocytes, UA: NEGATIVE
Nitrite: NEGATIVE
Protein, ur: NEGATIVE mg/dL
Specific Gravity, Urine: 1.02 (ref 1.005–1.030)
Urobilinogen, UA: 0.2 mg/dL (ref 0.0–1.0)
pH: 5 (ref 5.0–8.0)

## 2013-03-29 LAB — URINE MICROSCOPIC-ADD ON

## 2013-03-29 LAB — BASIC METABOLIC PANEL
BUN: 19 mg/dL (ref 6–23)
CO2: 24 mEq/L (ref 19–32)
Calcium: 9.2 mg/dL (ref 8.4–10.5)
Chloride: 100 mEq/L (ref 96–112)
Creatinine, Ser: 1.48 mg/dL — ABNORMAL HIGH (ref 0.50–1.35)
GFR calc Af Amer: 48 mL/min — ABNORMAL LOW (ref >90)
GFR calc non Af Amer: 41 mL/min — ABNORMAL LOW (ref >90)
Glucose, Bld: 137 mg/dL — ABNORMAL HIGH (ref 70–99)
Potassium: 3.8 mEq/L (ref 3.5–5.1)
Sodium: 139 mEq/L (ref 135–145)

## 2013-03-29 LAB — CBC WITH DIFFERENTIAL/PLATELET
Basophils Absolute: 0 10*3/uL (ref 0.0–0.1)
Basophils Relative: 0 % (ref 0–1)
Eosinophils Absolute: 0.1 10*3/uL (ref 0.0–0.7)
Eosinophils Relative: 1 % (ref 0–5)
HCT: 43 % (ref 39.0–52.0)
Hemoglobin: 13.6 g/dL (ref 13.0–17.0)
Lymphocytes Relative: 25 % (ref 12–46)
Lymphs Abs: 1.7 10*3/uL (ref 0.7–4.0)
MCH: 27.8 pg (ref 26.0–34.0)
MCHC: 31.6 g/dL (ref 30.0–36.0)
MCV: 87.9 fL (ref 78.0–100.0)
Monocytes Absolute: 0.3 10*3/uL (ref 0.1–1.0)
Monocytes Relative: 4 % (ref 3–12)
Neutro Abs: 4.7 10*3/uL (ref 1.7–7.7)
Neutrophils Relative %: 70 % (ref 43–77)
Platelets: 182 10*3/uL (ref 150–400)
RBC: 4.89 MIL/uL (ref 4.22–5.81)
RDW: 14.1 % (ref 11.5–15.5)
WBC: 6.7 10*3/uL (ref 4.0–10.5)

## 2013-03-29 NOTE — ED Notes (Signed)
EAV:[id number]<JX> Expected date:03/29/13<BR> Expected time: 7:20 PM<BR> Means of arrival:Ambulance<BR> Comments:<BR> Found unresponsive

## 2013-03-29 NOTE — ED Notes (Signed)
Per EMS, daughter stated he was on couch and she heard loud noise.  Found him slumped over and unresponsive. Snoring-like sounds with resperations.   Noted twitching to arms.  Pt arousable to stimuli on EMS arrival.  Pt has hx of seizures but has not had seizure in years.  Not on medications.  HX seizures, DM, HTN, pain meds for chronic.  Vitals: 100 palpated, 82 pulse, resp 22, 118/68 bp, cbg 121, sats 98% ra

## 2013-03-29 NOTE — ED Provider Notes (Signed)
History    79yM with episode of unresponsiveness at home. After dinner pt took his evening meds and found by his daughter on cough and difficult to awaken. Snoring respirations. Distant seizure hx. No seizure activity noted. No incontinence. Pt currently drowsy but responding appropriately. He is not exactly sure what happened. He has no acute complaints.   CSN: [id number]  Arrival date & time 03/29/13  Barry Brunner   First MD Initiated Contact with Patient 03/29/13 1937      Chief Complaint  Patient presents with  . Altered Mental Status    (Consider location/radiation/quality/duration/timing/severity/associated sxs/prior treatment) HPI  Past Medical History  Diagnosis Date  . COPD (chronic obstructive pulmonary disease)   . HTN (hypertension)   . Hyperlipidemia   . CKD (chronic kidney disease)   . Former smoker, stopped smoking many years ago     Past Surgical History  Procedure Laterality Date  . Cholecystectomy      History reviewed. No pertinent family history.  History  Substance Use Topics  . Smoking status: Former Games developer  . Smokeless tobacco: Not on file  . Alcohol Use: No      Review of Systems  All systems reviewed and negative, other than as noted in HPI.  Allergies  Review of patient's allergies indicates no known allergies.  Home Medications   Current Outpatient Rx  Name  Route  Sig  Dispense  Refill  . albuterol (PROVENTIL) (2.5 MG/3ML) 0.083% nebulizer solution   Nebulization   Take 2.5 mg by nebulization every 6 (six) hours as needed. For wheezing         . albuterol-ipratropium (COMBIVENT) 18-103 MCG/ACT inhaler   Inhalation   Inhale 1 puff into the lungs 2 (two) times daily. WHEEZING         . aspirin 81 MG chewable tablet   Oral   Chew 81 mg by mouth daily.         Marland Kitchen docusate sodium (COLACE) 100 MG capsule   Oral   Take 100 mg by mouth 2 (two) times daily.         Marland Kitchen donepezil (ARICEPT) 10 MG tablet   Oral   Take 10 mg by mouth  at bedtime as needed.         . Fluticasone-Salmeterol (ADVAIR) 100-50 MCG/DOSE AEPB   Inhalation   Inhale 1 puff into the lungs every 12 (twelve) hours.         . predniSONE (DELTASONE) 10 MG tablet   Oral   Take 10 mg by mouth daily.         Bernadette Hoit Sodium (SENOKOT S PO)   Oral   Take 2 tablets by mouth daily.          . traZODone (DESYREL) 50 MG tablet   Oral   Take 100 mg by mouth at bedtime.         . Vitamin D, Ergocalciferol, (DRISDOL) 50000 UNITS CAPS   Oral   Take 50,000 Units by mouth every 7 (seven) days.         Marland Kitchen EXPIRED: amLODipine (NORVASC) 10 MG tablet   Oral   Take 1 tablet (10 mg total) by mouth daily.   30 tablet   2   . famotidine (PEPCID) 20 MG tablet   Oral   Take 20 mg by mouth 2 (two) times daily.         Marland Kitchen EXPIRED: lisinopril (PRINIVIL,ZESTRIL) 20 MG tablet   Oral   Take 1 tablet (20  mg total) by mouth daily.   30 tablet   2   . morphine (MS CONTIN) 15 MG 12 hr tablet   Oral   Take 15 mg by mouth 2 (two) times daily.            BP 138/68  Pulse 74  Temp(Src) 97.8 F (36.6 C) (Oral)  Resp 10  SpO2 95%  Physical Exam  Nursing note and vitals reviewed. Constitutional: He appears well-developed and well-nourished. No distress.  HENT:  Head: Normocephalic and atraumatic.  Eyes: Conjunctivae are normal. Right eye exhibits no discharge. Left eye exhibits no discharge.  Neck: Neck supple.  Cardiovascular: Normal rate, regular rhythm and normal heart sounds.  Exam reveals no gallop and no friction rub.   No murmur heard. Pulmonary/Chest: Effort normal and breath sounds normal. No respiratory distress.  Abdominal: Soft. He exhibits no distension. There is no tenderness.  Musculoskeletal: He exhibits no edema and no tenderness.  Symmetric pedal edema. No cellulitic changes.   Neurological: He is alert. No cranial nerve deficit. He exhibits normal muscle tone. Coordination normal.  Mildly drowsy. Opens eyes to  conversational voice. Answers questions appropriately but is confused as to exact date. Follows commands. Speech clear.   Skin: Skin is warm and dry.  Psychiatric: He has a normal mood and affect. His behavior is normal. Thought content normal.    ED Course  Procedures (including critical care time)  Labs Reviewed  BASIC METABOLIC PANEL - Abnormal; Notable for the following:    Glucose, Bld 137 (*)    Creatinine, Ser 1.48 (*)    GFR calc non Af Amer 41 (*)    GFR calc Af Amer 48 (*)    All other components within normal limits  URINALYSIS, ROUTINE W REFLEX MICROSCOPIC - Abnormal; Notable for the following:    Hgb urine dipstick TRACE (*)    All other components within normal limits  CBC WITH DIFFERENTIAL  URINE MICROSCOPIC-ADD ON   Dg Chest 2 View  03/29/2013  *RADIOLOGY REPORT*  Clinical Data: Weakness and shortness of breath  CHEST - 2 VIEW  Comparison: Chest radiograph 10/19/2012  Findings: Lateral view is somewhat limited by patient's arms, which project over the chest.  There is borderline cardiomegaly.  The lung volumes are low bilaterally.  There is atherosclerotic calcification of the thoracic aortic arch.  There is mild peribronchial thickening at the lung bases.  No discrete airspace disease or pleural effusion is identified.  Negative for pneumothorax.  The patient's chin projects over the left lung apex.  IMPRESSION:  1.  Low lung volumes with mild peribronchial thickening at the lung bases.  Cannot exclude bronchitis or viral infection. 2.  Borderline cardiomegaly.   Original Report Authenticated By: Britta Mccreedy, M.D.    Ct Head Wo Contrast  03/29/2013  *RADIOLOGY REPORT*  Clinical Data: Altered mental status.  Confusion.  CT HEAD WITHOUT CONTRAST  Technique:  Contiguous axial images were obtained from the base of the skull through the vertex without contrast.  Comparison:  01/11/2012.  Findings: There is no evidence for acute infarction, intracranial hemorrhage, mass lesion,  hydrocephalus, or extra-axial fluid. Moderate atrophy with chronic microvascular ischemic change. Remote lacunar infarction left periventricular white matter persists. Calvarium intact.  Moderate chronic ethmoid and frontal sinus disease with air-fluid levels in the maxillary and sphenoid sinuses.  Vascular calcification.  Similar appearance to priors.  IMPRESSION: Atrophy and chronic microvascular ischemic change.  No definite acute infarction.  Acute on chronic sinusitis.  Original Report Authenticated By: Davonna Belling, M.D.    EKG:  Rhythm: Vent. rate 77 BPM PR interval 200 ms QRS duration 90 ms QT/QTc 343/388 ms ST segments:   1. Unresponsive episode       MDM  79 year old male with an episode of decreased responsiveness shortly before arrival. I discussed the patient's PACE worker [place]. She reports that his trazodone was recently increased to help with difficulty sleeping. This may be the potential etiology of this event. Patient does have a past history of seizures, but he states he has not had one in several years. He is not on any antiepileptics. Patient is following commands and answering questions appropriately. No focal motor deficits. He is disoriented to time. This appears to be his baseline per report. He does have a history of dementia. He has no complaints. Afebrile and has remained HD stable throughout ER stay. W/u today fairly unremarkable. EKG stable from previous. CT head w/o acute findings. I feel pt is safe for discharge at this time. I tried calling pt's daughter twice w/o response.   Daughter, Loma Sender, [phone]. Claris Gower, PACE worker, [phone]  Daughter called back to ED. Discussed plan for discharge. Discussed concerns that episode today may be related to recently increased trazadone dose.    Raeford Razor, MD 03/31/13 [phone]

## 2013-03-30 NOTE — ED Notes (Signed)
Daughter notified of D/C home.  Dtr at home to receive.  Pt ambulated 5 feet at baseline ability prior to d/c.

## 2013-04-16 IMAGING — CR DG CHEST 1V PORT
1 series · 1 of 1 positions shown · non-contrast
Comparison: 09/18/2012

CLINICAL DATA: Shortness of breath.

PORTABLE CHEST - 1 VIEW

[AP]
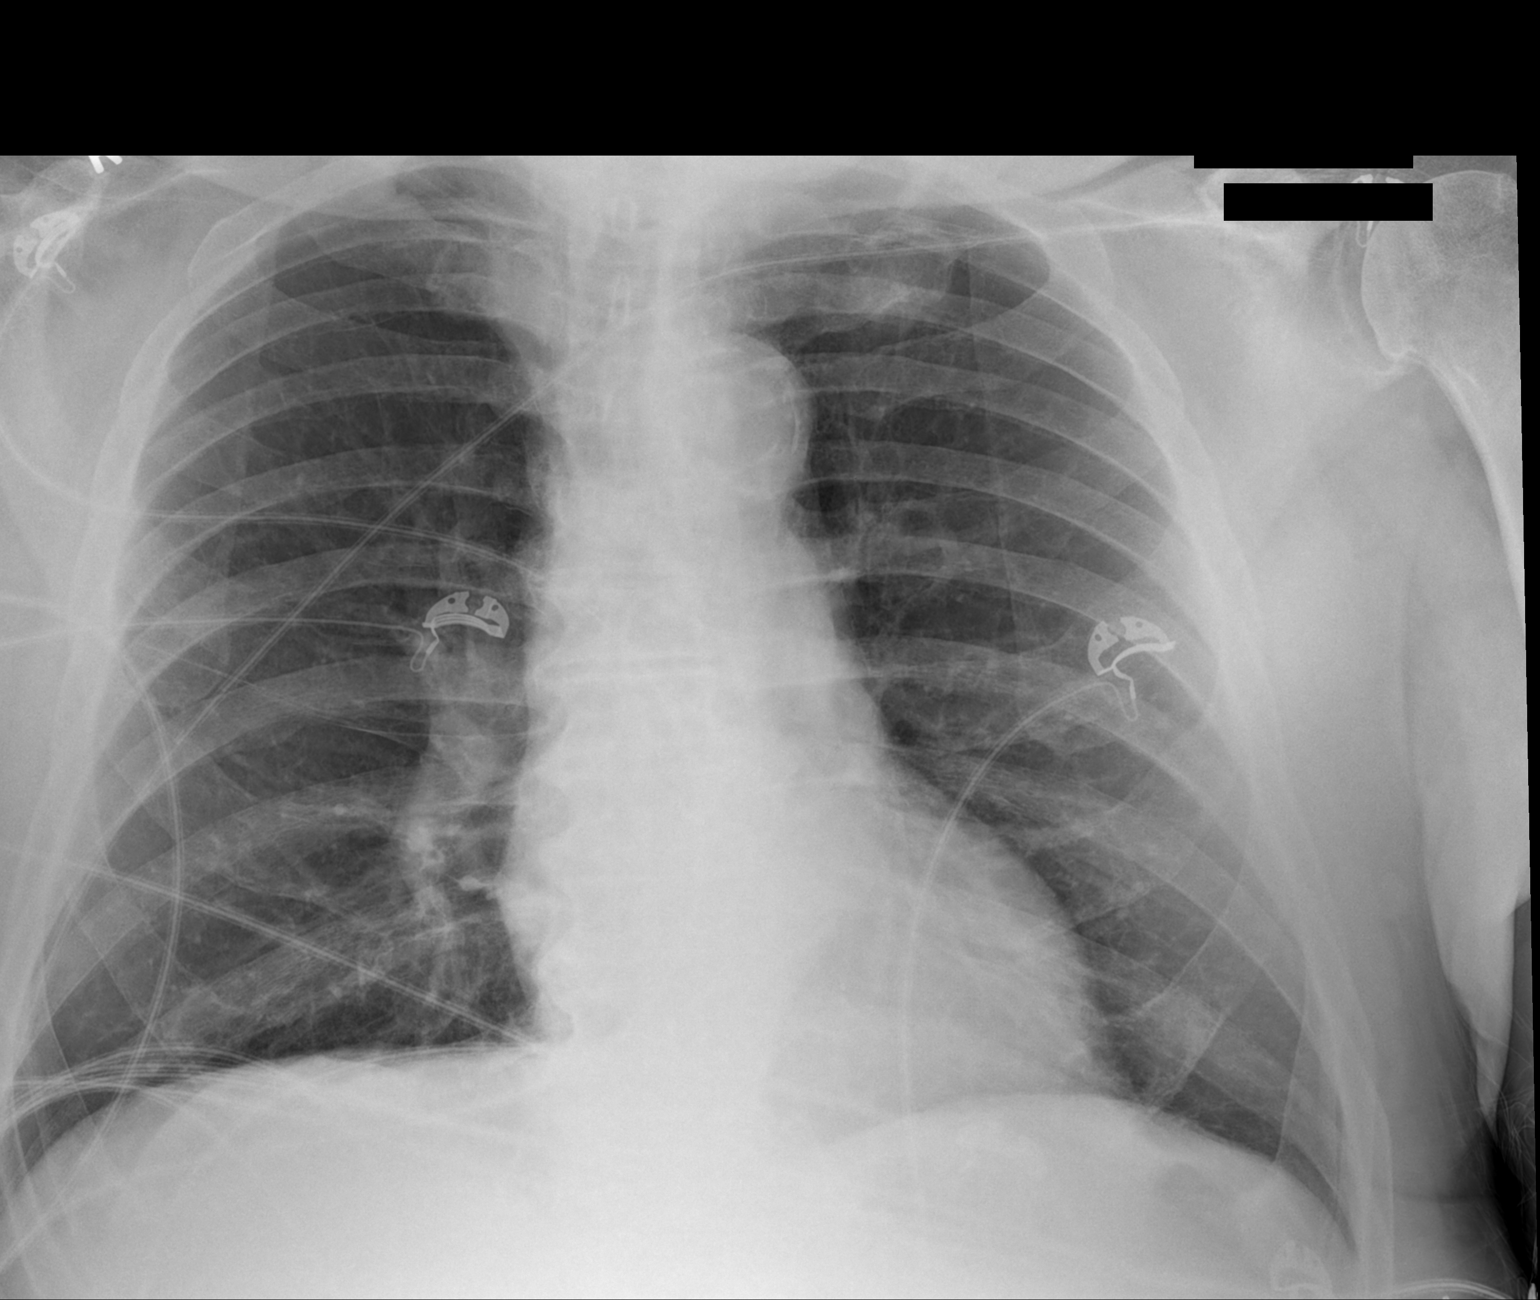

[1 of 1 positions shown; findings below may reference images not displayed]

FINDINGS: Heart and mediastinal contours are within normal limits.
No focal opacities or effusions.  No acute bony abnormality.
IMPRESSION: No active cardiopulmonary disease.

## 2013-12-08 ENCOUNTER — Encounter (HOSPITAL_COMMUNITY): Payer: Self-pay | Admitting: Emergency Medicine

## 2013-12-08 ENCOUNTER — Emergency Department (HOSPITAL_COMMUNITY)
Admission: EM | Admit: 2013-12-08 | Discharge: 2013-12-09 | Disposition: A | Discharge status: Home / Self Care | Payer: Medicare (Managed Care) | Attending: Emergency Medicine | Admitting: Emergency Medicine

## 2013-12-08 ENCOUNTER — Emergency Department (HOSPITAL_COMMUNITY): Payer: Medicare (Managed Care)

## 2013-12-08 DIAGNOSIS — E785 Hyperlipidemia, unspecified: Secondary | ICD-10-CM | POA: Insufficient documentation

## 2013-12-08 DIAGNOSIS — F0390 Unspecified dementia without behavioral disturbance: Secondary | ICD-10-CM | POA: Insufficient documentation

## 2013-12-08 DIAGNOSIS — Z87891 Personal history of nicotine dependence: Secondary | ICD-10-CM | POA: Insufficient documentation

## 2013-12-08 DIAGNOSIS — IMO0002 Reserved for concepts with insufficient information to code with codable children: Secondary | ICD-10-CM | POA: Insufficient documentation

## 2013-12-08 DIAGNOSIS — Z79899 Other long term (current) drug therapy: Secondary | ICD-10-CM | POA: Insufficient documentation

## 2013-12-08 DIAGNOSIS — Z7982 Long term (current) use of aspirin: Secondary | ICD-10-CM | POA: Insufficient documentation

## 2013-12-08 DIAGNOSIS — I129 Hypertensive chronic kidney disease with stage 1 through stage 4 chronic kidney disease, or unspecified chronic kidney disease: Secondary | ICD-10-CM | POA: Insufficient documentation

## 2013-12-08 DIAGNOSIS — J441 Chronic obstructive pulmonary disease with (acute) exacerbation: Secondary | ICD-10-CM | POA: Insufficient documentation

## 2013-12-08 DIAGNOSIS — R55 Syncope and collapse: Secondary | ICD-10-CM | POA: Insufficient documentation

## 2013-12-08 DIAGNOSIS — N189 Chronic kidney disease, unspecified: Secondary | ICD-10-CM | POA: Insufficient documentation

## 2013-12-08 LAB — CBC WITH DIFFERENTIAL/PLATELET
BASOS PCT: 0 % (ref 0–1)
Basophils Absolute: 0 10*3/uL (ref 0.0–0.1)
Eosinophils Absolute: 0.1 10*3/uL (ref 0.0–0.7)
Eosinophils Relative: 1 % (ref 0–5)
HEMATOCRIT: 44.3 % (ref 39.0–52.0)
HEMOGLOBIN: 14.7 g/dL (ref 13.0–17.0)
Lymphocytes Relative: 42 % (ref 12–46)
Lymphs Abs: 2.5 10*3/uL (ref 0.7–4.0)
MCH: 29.8 pg (ref 26.0–34.0)
MCHC: 33.2 g/dL (ref 30.0–36.0)
MCV: 89.9 fL (ref 78.0–100.0)
MONO ABS: 0.4 10*3/uL (ref 0.1–1.0)
MONOS PCT: 7 % (ref 3–12)
NEUTROS ABS: 3 10*3/uL (ref 1.7–7.7)
Neutrophils Relative %: 50 % (ref 43–77)
Platelets: 137 10*3/uL — ABNORMAL LOW (ref 150–400)
RBC: 4.93 MIL/uL (ref 4.22–5.81)
RDW: 13.7 % (ref 11.5–15.5)
WBC: 5.9 10*3/uL (ref 4.0–10.5)

## 2013-12-08 MED ORDER — ALBUTEROL SULFATE (2.5 MG/3ML) 0.083% IN NEBU
2.5000 mg | INHALATION_SOLUTION | Freq: Once | RESPIRATORY_TRACT | Status: AC
  Administered 2013-12-08: 2.5 mg via RESPIRATORY_TRACT
  Filled 2013-12-08: qty 3

## 2013-12-08 MED ORDER — METHYLPREDNISOLONE SODIUM SUCC 125 MG IJ SOLR
125.0000 mg | Freq: Once | INTRAMUSCULAR | Status: AC
  Administered 2013-12-08: 125 mg via INTRAVENOUS
  Filled 2013-12-08: qty 2

## 2013-12-08 MED ORDER — IPRATROPIUM BROMIDE 0.02 % IN SOLN
0.5000 mg | Freq: Once | RESPIRATORY_TRACT | Status: AC
  Administered 2013-12-08: 0.5 mg via RESPIRATORY_TRACT
  Filled 2013-12-08: qty 2.5

## 2013-12-08 NOTE — ED Notes (Signed)
Per EMS pt from home called out for alerted LOC, family sts pt has not been "acting himself" sts has been agitated and had a near/possible syncopal episode family sts had difficulty arousing patient. At EMS arrival pt alert and oriented, c/o abdominal pain. Pt pleasant.

## 2013-12-09 LAB — BASIC METABOLIC PANEL
BUN: 18 mg/dL (ref 6–23)
CHLORIDE: 101 meq/L (ref 96–112)
CO2: 28 mEq/L (ref 19–32)
CREATININE: 1.26 mg/dL (ref 0.50–1.35)
Calcium: 9 mg/dL (ref 8.4–10.5)
GFR calc non Af Amer: 50 mL/min — ABNORMAL LOW (ref >90)
GFR, EST AFRICAN AMERICAN: 58 mL/min — AB (ref >90)
GLUCOSE: 130 mg/dL — AB (ref 70–99)
Potassium: 3.9 mEq/L (ref 3.7–5.3)
Sodium: 141 mEq/L (ref 137–147)

## 2013-12-09 LAB — URINALYSIS, ROUTINE W REFLEX MICROSCOPIC
BILIRUBIN URINE: NEGATIVE
GLUCOSE, UA: NEGATIVE mg/dL
HGB URINE DIPSTICK: NEGATIVE
KETONES UR: NEGATIVE mg/dL
Leukocytes, UA: NEGATIVE
Nitrite: NEGATIVE
PH: 5.5 (ref 5.0–8.0)
Protein, ur: NEGATIVE mg/dL
Specific Gravity, Urine: 1.024 (ref 1.005–1.030)
Urobilinogen, UA: 0.2 mg/dL (ref 0.0–1.0)

## 2013-12-09 MED ORDER — PREDNISONE 20 MG PO TABS: 60.0000 mg | ORAL_TABLET | Freq: Every day | ORAL | Status: DC

## 2013-12-09 NOTE — Discharge Instructions (Signed)
Please use your inhalers every 4 hours as needed over the next few days for wheezing or cough.  Take prednisone as prescribed.  Followup with your doctor on Monday.   Chronic Obstructive Pulmonary Disease Exacerbation  Chronic obstructive pulmonary disease (COPD) is a common lung problem. In COPD, the flow of air from the lungs is limited. COPD exacerbations are times that breathing gets worse and you need extra treatment. Without treatment they can be life threatening. If they happen often, your lungs can become more damaged. HOME CARE  Do not smoke.  Avoid tobacco smoke and other things that bother your lungs.  If given, take your antibiotic medicine as told. Finish the medicine even if you start to feel better.  Only take medicines as told by your doctor.  Drink enough fluids to keep your pee (urine) clear or pale yellow (unless your doctor has told you not to).  Use a cool mist machine (vaporizer).  If you use oxygen or a machine that turns liquid medicine into a mist (nebulizer), continue to use them as told.  Keep up with shots (vaccinations) as told by your doctor.  Exercise regularly.  Eat healthy foods.  Keep all doctor visits as told. GET HELP RIGHT AWAY IF:  You are very short of breath and it gets worse.  You have trouble talking.  You have bad chest pain.  You have blood in your spit (sputum).  You have a fever.  You keep throwing up (vomiting).  You feel weak, or you pass out (faint).  You feel confused.  You keep getting worse. MAKE SURE YOU:   Understand these instructions.  Will watch your condition.  Will get help right away if you are not doing well or get worse. Document Released: 10/23/2011 Document Revised: 08/24/2013 Document Reviewed: 07/08/2013 [facility] Patient Information 2014 [employer], [place].

## 2013-12-09 NOTE — ED Provider Notes (Signed)
CSN: [id number]     Arrival date & time 12/08/13  2130 History   First MD Initiated Contact with Patient 12/08/13 2313     Chief Complaint  Patient presents with  . Near Syncope  . Shortness of Breath   (Consider location/radiation/quality/duration/timing/severity/associated sxs/prior Treatment) HPI 80 year old male presents to emergency room from home via EMS.  Patient is complaining of cough for last 2-3 days and shortness of breath.  EMS reports episode of altered LOC.  With agitation and somnolence.  No somnolence upon patient's arrival, no family available for further history.  Patient denies any fever, chills, nausea, vomiting, diarrhea.  He reports he is eating and drinking well. Past Medical History  Diagnosis Date  . COPD (chronic obstructive pulmonary disease)   . HTN (hypertension)   . Hyperlipidemia   . CKD (chronic kidney disease)    Past Surgical History  Procedure Laterality Date  . Cholecystectomy     No family history on file. History  Substance Use Topics  . Smoking status: Former Games developer  . Smokeless tobacco: Not on file  . Alcohol Use: No    Review of Systems  Unable to perform ROS: Dementia    Allergies  Review of patient's allergies indicates no known allergies.  Home Medications   Current Outpatient Rx  Name  Route  Sig  Dispense  Refill  . albuterol (PROVENTIL) (2.5 MG/3ML) 0.083% nebulizer solution   Nebulization   Take 2.5 mg by nebulization every 6 (six) hours as needed. For wheezing         . albuterol-ipratropium (COMBIVENT) 18-103 MCG/ACT inhaler   Inhalation   Inhale 1 puff into the lungs 2 (two) times daily. WHEEZING         . EXPIRED: amLODipine (NORVASC) 10 MG tablet   Oral   Take 1 tablet (10 mg total) by mouth daily.   30 tablet   2   . aspirin 81 MG chewable tablet   Oral   Chew 81 mg by mouth daily.         Marland Kitchen docusate sodium (COLACE) 100 MG capsule   Oral   Take 100 mg by mouth 2 (two) times daily.         Marland Kitchen  donepezil (ARICEPT) 10 MG tablet   Oral   Take 10 mg by mouth at bedtime as needed.         . famotidine (PEPCID) 20 MG tablet   Oral   Take 20 mg by mouth 2 (two) times daily.         . Fluticasone-Salmeterol (ADVAIR) 100-50 MCG/DOSE AEPB   Inhalation   Inhale 1 puff into the lungs every 12 (twelve) hours.         Marland Kitchen EXPIRED: lisinopril (PRINIVIL,ZESTRIL) 20 MG tablet   Oral   Take 1 tablet (20 mg total) by mouth daily.   30 tablet   2   . morphine (MS CONTIN) 15 MG 12 hr tablet   Oral   Take 15 mg by mouth 2 (two) times daily.          . predniSONE (DELTASONE) 10 MG tablet   Oral   Take 10 mg by mouth daily.         Bernadette Hoit Sodium (SENOKOT S PO)   Oral   Take 2 tablets by mouth daily.          . traZODone (DESYREL) 50 MG tablet   Oral   Take 100 mg by mouth at bedtime.         Marland Kitchen  Vitamin D, Ergocalciferol, (DRISDOL) 50000 UNITS CAPS   Oral   Take 50,000 Units by mouth every 7 (seven) days.          BP 158/83  Pulse 82  Temp(Src) 97.5 F (36.4 C) (Oral)  Resp 20  Ht 6' (1.829 m)  Wt 225 lb (102.059 kg)  BMI 30.51 kg/m2  SpO2 97% Physical Exam  Nursing note and vitals reviewed. Constitutional: He appears well-developed and well-nourished.  HENT:  Head: Normocephalic and atraumatic.  Nose: Nose normal.  Mouth/Throat: Oropharynx is clear and moist.  Eyes: Conjunctivae and EOM are normal. Pupils are equal, round, and reactive to light.  Neck: Normal range of motion. Neck supple. No JVD present. No tracheal deviation present. No thyromegaly present.  Cardiovascular: Normal rate, regular rhythm, normal heart sounds and intact distal pulses.  Exam reveals no gallop and no friction rub.   No murmur heard. Pulmonary/Chest: Effort normal. No stridor. No respiratory distress. He has wheezes. He has no rales. He exhibits no tenderness.  Abdominal: Soft. Bowel sounds are normal. He exhibits no distension and no mass. There is no tenderness.  There is no rebound and no guarding.  Musculoskeletal: Normal range of motion. He exhibits no edema and no tenderness.  Lymphadenopathy:    He has no cervical adenopathy.  Neurological: He is alert. He exhibits normal muscle tone. Coordination normal.  Skin: Skin is warm and dry. No rash noted. No erythema. No pallor.  Psychiatric: He has a normal mood and affect. His behavior is normal. Judgment and thought content normal.    ED Course  Procedures (including critical care time) Labs Review Labs Reviewed  CBC WITH DIFFERENTIAL - Abnormal; Notable for the following:    Platelets 137 (*)    All other components within normal limits  BASIC METABOLIC PANEL - Abnormal; Notable for the following:    Glucose, Bld 130 (*)    GFR calc non Af Amer 50 (*)    GFR calc Af Amer 58 (*)    All other components within normal limits  URINALYSIS, ROUTINE W REFLEX MICROSCOPIC - Abnormal; Notable for the following:    APPearance CLOUDY (*)    All other components within normal limits   Imaging Review Dg Chest 2 View (if Patient Has Fever And/or Copd)  12/08/2013   CLINICAL DATA:  Near syncope and shortness of breath.  EXAM: CHEST  2 VIEW  COMPARISON:  Chest radiograph performed 03/29/2013  FINDINGS: The lungs are well-aerated and clear. There is no evidence of focal opacification, pleural effusion or pneumothorax.  The heart is normal in size; the mediastinal contour is within normal limits. No acute osseous abnormalities are seen. Clips are noted within the right upper quadrant, reflecting prior cholecystectomy.  IMPRESSION: No acute cardiopulmonary process seen.   Electronically Signed   By: Roanna Raider M.D.   On: 12/08/2013 23:01    EKG Interpretation    Date/Time:  Thursday December 08 2013 21:46:44 EST Ventricular Rate:  86 PR Interval:  202 QRS Duration: 86 QT Interval:  364 QTC Calculation: 435 R Axis:   -50 Text Interpretation:  Sinus rhythm Left anterior fascicular block Nonspecific T  abnormalities, diffuse leads new t wave inversions from prior Confirmed by Deborah Lazcano  MD, Racquel Arkin ([id number]) on 12/08/2013 11:15:38 PM            MDM   1. COPD exacerbation    79 year old male with wheezing and cough.  Will need to touch base with family as there is  report of altered level of consciousness and agitation.  None seen here.    Olivia Mackielga M Kenyona Rena, MD 12/09/13 0120

## 2013-12-17 ENCOUNTER — Encounter (HOSPITAL_COMMUNITY): Payer: Self-pay | Admitting: Emergency Medicine

## 2013-12-17 ENCOUNTER — Emergency Department (HOSPITAL_COMMUNITY)
Admission: EM | Admit: 2013-12-17 | Discharge: 2013-12-17 | Disposition: A | Discharge status: Home / Self Care | Payer: Medicare (Managed Care) | Attending: Emergency Medicine | Admitting: Emergency Medicine

## 2013-12-17 ENCOUNTER — Emergency Department (HOSPITAL_COMMUNITY): Payer: Medicare (Managed Care)

## 2013-12-17 DIAGNOSIS — N189 Chronic kidney disease, unspecified: Secondary | ICD-10-CM | POA: Insufficient documentation

## 2013-12-17 DIAGNOSIS — D696 Thrombocytopenia, unspecified: Secondary | ICD-10-CM | POA: Insufficient documentation

## 2013-12-17 DIAGNOSIS — Z7982 Long term (current) use of aspirin: Secondary | ICD-10-CM | POA: Insufficient documentation

## 2013-12-17 DIAGNOSIS — Z8639 Personal history of other endocrine, nutritional and metabolic disease: Secondary | ICD-10-CM | POA: Insufficient documentation

## 2013-12-17 DIAGNOSIS — I129 Hypertensive chronic kidney disease with stage 1 through stage 4 chronic kidney disease, or unspecified chronic kidney disease: Secondary | ICD-10-CM | POA: Insufficient documentation

## 2013-12-17 DIAGNOSIS — IMO0002 Reserved for concepts with insufficient information to code with codable children: Secondary | ICD-10-CM | POA: Insufficient documentation

## 2013-12-17 DIAGNOSIS — Z862 Personal history of diseases of the blood and blood-forming organs and certain disorders involving the immune mechanism: Secondary | ICD-10-CM | POA: Insufficient documentation

## 2013-12-17 DIAGNOSIS — F0390 Unspecified dementia without behavioral disturbance: Secondary | ICD-10-CM | POA: Insufficient documentation

## 2013-12-17 DIAGNOSIS — Z9089 Acquired absence of other organs: Secondary | ICD-10-CM | POA: Insufficient documentation

## 2013-12-17 DIAGNOSIS — Z79899 Other long term (current) drug therapy: Secondary | ICD-10-CM | POA: Insufficient documentation

## 2013-12-17 DIAGNOSIS — J441 Chronic obstructive pulmonary disease with (acute) exacerbation: Secondary | ICD-10-CM | POA: Insufficient documentation

## 2013-12-17 DIAGNOSIS — N289 Disorder of kidney and ureter, unspecified: Secondary | ICD-10-CM

## 2013-12-17 DIAGNOSIS — Z87891 Personal history of nicotine dependence: Secondary | ICD-10-CM | POA: Insufficient documentation

## 2013-12-17 LAB — POCT I-STAT TROPONIN I: Troponin i, poc: 0.05 ng/mL (ref 0.00–0.08)

## 2013-12-17 LAB — CBC
HCT: 45.8 % (ref 39.0–52.0)
Hemoglobin: 14.8 g/dL (ref 13.0–17.0)
MCH: 29.5 pg (ref 26.0–34.0)
MCHC: 32.3 g/dL (ref 30.0–36.0)
MCV: 91.4 fL (ref 78.0–100.0)
Platelets: 103 10*3/uL — ABNORMAL LOW (ref 150–400)
RBC: 5.01 MIL/uL (ref 4.22–5.81)
RDW: 13.9 % (ref 11.5–15.5)
WBC: 8.9 10*3/uL (ref 4.0–10.5)

## 2013-12-17 LAB — BASIC METABOLIC PANEL
BUN: 29 mg/dL — AB (ref 6–23)
CHLORIDE: 99 meq/L (ref 96–112)
CO2: 27 meq/L (ref 19–32)
CREATININE: 1.47 mg/dL — AB (ref 0.50–1.35)
Calcium: 9 mg/dL (ref 8.4–10.5)
GFR calc Af Amer: 48 mL/min — ABNORMAL LOW (ref >90)
GFR calc non Af Amer: 41 mL/min — ABNORMAL LOW (ref >90)
GLUCOSE: 193 mg/dL — AB (ref 70–99)
Potassium: 3.6 mEq/L — ABNORMAL LOW (ref 3.7–5.3)
Sodium: 143 mEq/L (ref 137–147)

## 2013-12-17 MED ORDER — IPRATROPIUM BROMIDE 0.02 % IN SOLN
0.5000 mg | Freq: Once | RESPIRATORY_TRACT | Status: AC
  Administered 2013-12-17: 0.5 mg via RESPIRATORY_TRACT
  Filled 2013-12-17: qty 2.5

## 2013-12-17 MED ORDER — ALBUTEROL SULFATE (2.5 MG/3ML) 0.083% IN NEBU
5.0000 mg | INHALATION_SOLUTION | Freq: Once | RESPIRATORY_TRACT | Status: AC
  Administered 2013-12-17: 5 mg via RESPIRATORY_TRACT
  Filled 2013-12-17: qty 6

## 2013-12-17 MED ORDER — RACEPINEPHRINE HCL 2.25 % IN NEBU
0.5000 mL | INHALATION_SOLUTION | Freq: Once | RESPIRATORY_TRACT | Status: AC
  Administered 2013-12-17: 0.5 mL via RESPIRATORY_TRACT
  Filled 2013-12-17: qty 0.5

## 2013-12-17 MED ORDER — PREDNISONE 20 MG PO TABS
60.0000 mg | ORAL_TABLET | Freq: Every day | ORAL | Status: DC
Start: 2013-12-17 — End: 2014-07-28

## 2013-12-17 MED ORDER — METHYLPREDNISOLONE SODIUM SUCC 125 MG IJ SOLR
125.0000 mg | Freq: Once | INTRAMUSCULAR | Status: AC
  Administered 2013-12-17: 125 mg via INTRAVENOUS
  Filled 2013-12-17: qty 2

## 2013-12-17 NOTE — ED Notes (Signed)
Per EMS pt. From home reported by son of SOB , cough and wheezing  Since last night. Son reported of giving pt. Breathing treatment but no relief.  Pt. Has dementia. Received breathing treatment x2 by EMS.

## 2013-12-17 NOTE — ED Notes (Signed)
Pt daughter called giving number of pts ride home Intel CorporationMarshal Bieser [phone]

## 2013-12-17 NOTE — ED Provider Notes (Signed)
CSN: [id number]     Arrival date & time 12/17/13  0315 History   First MD Initiated Contact with Patient 12/17/13 0327     Chief Complaint  Patient presents with  . Shortness of Breath  . Wheezing  . Weakness  . Cough   (Consider location/radiation/quality/duration/timing/severity/associated sxs/prior Treatment) Patient is a 80 y.o. male presenting with shortness of breath, wheezing, weakness, and cough. The history is provided by the EMS personnel. The history is limited by the condition of the patient (Dementia).  Shortness of Breath Associated symptoms: cough and wheezing   Wheezing Associated symptoms: cough and shortness of breath   Weakness Associated symptoms include shortness of breath.  Cough Associated symptoms: shortness of breath and wheezing   He came in by ambulance because of cough and difficulty breathing. His son gave information to EMS but did not come to the ED with the patient. He is reported to have onset last night no cough, dyspnea and wheezing. He is reported to have received breathing treatments at home without relief. EMS gave him additional breathing treatments en route. Patient is completely unable to give any history.  Past Medical History  Diagnosis Date  . COPD (chronic obstructive pulmonary disease)   . HTN (hypertension)   . Hyperlipidemia   . CKD (chronic kidney disease)    Past Surgical History  Procedure Laterality Date  . Cholecystectomy     History reviewed. No pertinent family history. History  Substance Use Topics  . Smoking status: Former Games developermoker  . Smokeless tobacco: Not on file  . Alcohol Use: No    Review of Systems  Unable to perform ROS: Dementia  Respiratory: Positive for cough, shortness of breath and wheezing.   Neurological: Positive for weakness.    Allergies  Review of patient's allergies indicates no known allergies.  Home Medications   Current Outpatient Rx  Name  Route  Sig  Dispense  Refill  . albuterol  (PROVENTIL) (2.5 MG/3ML) 0.083% nebulizer solution   Nebulization   Take 2.5 mg by nebulization every 6 (six) hours as needed. For wheezing         . albuterol-ipratropium (COMBIVENT) 18-103 MCG/ACT inhaler   Inhalation   Inhale 1 puff into the lungs 2 (two) times daily. WHEEZING         . EXPIRED: amLODipine (NORVASC) 10 MG tablet   Oral   Take 1 tablet (10 mg total) by mouth daily.   30 tablet   2   . aspirin 81 MG chewable tablet   Oral   Chew 81 mg by mouth daily.         Marland Kitchen. docusate sodium (COLACE) 100 MG capsule   Oral   Take 100 mg by mouth 2 (two) times daily.         Marland Kitchen. donepezil (ARICEPT) 10 MG tablet   Oral   Take 10 mg by mouth at bedtime as needed.         . famotidine (PEPCID) 20 MG tablet   Oral   Take 20 mg by mouth 2 (two) times daily.         . Fluticasone-Salmeterol (ADVAIR) 100-50 MCG/DOSE AEPB   Inhalation   Inhale 1 puff into the lungs every 12 (twelve) hours.         Marland Kitchen. EXPIRED: lisinopril (PRINIVIL,ZESTRIL) 20 MG tablet   Oral   Take 1 tablet (20 mg total) by mouth daily.   30 tablet   2   . morphine (MS CONTIN) 15  MG 12 hr tablet   Oral   Take 15 mg by mouth 2 (two) times daily.          . predniSONE (DELTASONE) 10 MG tablet   Oral   Take 10 mg by mouth daily.         . predniSONE (DELTASONE) 20 MG tablet   Oral   Take 3 tablets (60 mg total) by mouth daily.   15 tablet   0   . Sennosides-Docusate Sodium (SENOKOT S PO)   Oral   Take 2 tablets by mouth daily.          . traZODone (DESYREL) 50 MG tablet   Oral   Take 100 mg by mouth at bedtime.         . Vitamin D, Ergocalciferol, (DRISDOL) 50000 UNITS CAPS   Oral   Take 50,000 Units by mouth every 7 (seven) days.          BP 193/80  Pulse 58  Resp 18  SpO2 100% Physical Exam  Nursing note and vitals reviewed.  80 year old male, resting comfortably and in no acute distress. Vital signs are significant for hypertension with blood pressure 193/80, and  tavhycardia with heart rate of 137 (recorded heart rate of 58 is incorrect). Oxygen saturation is 100%, which is normal. Head is normocephalic and atraumatic. PERRLA, EOMI. Oropharynx is clear. Neck is nontender and supple without adenopathy or JVD. Back is nontender and there is no CVA tenderness. Lungs have coarse expiratory wheezes without rales or rhonchi. He is not using accessory muscles of respiration. Chest is nontender. Heart is tachycardic without murmur. Abdomen is soft, flat, nontender without masses or hepatosplenomegaly and peristalsis is normoactive. Extremities have no cyanosis or edema, full range of motion is present. Skin is warm and dry without rash. Neurologic: He is awake and alert but not oriented to person or place or time, cranial nerves are intact, there are no gross motor or sensory deficits.  ED Course  Procedures (including critical care time) Labs Review Labs Reviewed  BASIC METABOLIC PANEL  CBC   Imaging Review No results found.  EKG Interpretation    Date/Time:  Saturday December 17 2013 03:27:00 EST Ventricular Rate:  144 PR Interval:    QRS Duration: 90 QT Interval:  303 QTC Calculation: 469 R Axis:   -44 Text Interpretation:  Multifocal atrial tachycardia Left anterior fasicular block Borderline T wave abnormalities When compared with ECG of 12/08/2013, Multifocal atrial tachycardia has replaced Sinus rhythm Confirmed by Preston Fleeting  MD, Carlyle Achenbach ([id number]) on 12/17/2013 3:42:40 AM           CRITICAL CARE Performed by: [email or web address] Total critical care time: 40 minutes Critical care time was exclusive of separately billable procedures and treating other patients. Critical care was necessary to treat or prevent imminent or life-threatening deterioration. Critical care was time spent personally by me on the following activities: development of treatment plan with patient and/or surrogate as well as nursing, discussions with consultants, evaluation of  patient's response to treatment, examination of patient, obtaining history from patient or surrogate, ordering and performing treatments and interventions, ordering and review of laboratory studies, ordering and review of radiographic studies, pulse oximetry and re-evaluation of patient's condition.  MDM   1. COPD exacerbation   2. Renal insufficiency   3. Thrombocytopenia    COPD exacerbation. (Intravenous methylprednisolone and additional nebulizer treatments with albuterol and ipratropium. Old records are reviewed and he does have a prior ED visit for COPD  exacerbation but has not been hospitalized here for same.  4:35 AM Following nebulizer treatment, heart rate has remained elevated but is no longer having any wheezing. Respiratory noise seems to be in the upper airway. There is no true stridor and is not having any difficulty with secretions but he will be given a therapeutic file of nebulizer treatment with her ischemic epinephrine.  8:34 AM Breathing improved substantially with racemic epinephrine treatment and then remained stable. Because of response to racemic epinephrine, he was sent for CT of the neck to be sure he did not have epiglottitis or similar other serious pathology. This has come back unremarkable. He was observed in the ED and desires status did not deteriorate. The lungs were clear. He was taken off of oxygen to maintain oxygen saturation of 92% unremarkable. He is discharged with prescription for prednisone and that is to followup with his PCP to establish an adequate taper. He may need to be on long-term moderate dose steroids.  Dione Booze, MD 12/17/13 [phone]

## 2013-12-17 NOTE — ED Notes (Signed)
Bed: [id number] Expected date:  Expected time:  Means of arrival:  Comments: EMS/SOB/wheezing/cough/nebs

## 2013-12-17 NOTE — ED Notes (Signed)
Patient transported to CT 

## 2013-12-17 NOTE — Discharge Instructions (Signed)
Chronic Obstructive Pulmonary Disease  Chronic obstructive pulmonary disease (COPD) is a common lung condition in which airflow from the lungs is limited. COPD is a general term that can be used to describe many different lung problems that limit airflow, including both chronic bronchitis and emphysema.  If you have COPD, your lung function will probably never return to normal, but there are measures you can take to improve lung function and make yourself feel better.   CAUSES   · Smoking (common).    · Exposure to secondhand smoke.    · Genetic problems.  · Chronic inflammatory lung diseases or recurrent infections.  SYMPTOMS   · Shortness of breath, especially with physical activity.    · Deep, persistent (chronic) cough with a large amount of thick mucus.    · Wheezing.    · Rapid breaths (tachypnea).    · Gray or bluish discoloration (cyanosis) of the skin, especially in fingers, toes, or lips.    · Fatigue.    · Weight loss.    · Frequent infections or episodes when breathing symptoms become much worse (exacerbations).    · Chest tightness.  DIAGNOSIS   Your healthcare provider will take a medical history and perform a physical examination to make the initial diagnosis.  Additional tests for COPD may include:   · Lung (pulmonary) function tests.  · Chest X-ray.  · CT scan.  · Blood tests.  TREATMENT   Treatment available to help you feel better when you have COPD include:   · Inhaler and nebulizer medicines. These help manage the symptoms of COPD and make your breathing more comfortable  · Supplemental oxygen. Supplemental oxygen is only helpful if you have a low oxygen level in your blood.    · Exercise and physical activity. These are beneficial for nearly all people with COPD. Some people may also benefit from a pulmonary rehabilitation program.  HOME CARE INSTRUCTIONS   · Take all medicines (inhaled or pills) as directed by your health care provider.  · Only take over-the-counter or prescription medicines  for pain, fever, or discomfort as directed by your health care provider.    · Avoid over-the-counter medicines or cough syrups that dry up your airway (such as antihistamines) and slow down the elimination of secretions unless instructed otherwise by your healthcare provider.    · If you are a smoker, the most important thing that you can do is stop smoking. Continuing to smoke will cause further lung damage and breathing trouble. Ask your health care provider for help with quitting smoking. He or she can direct you to community resources or hospitals that provide support.  · Avoid exposure to irritants such as smoke, chemicals, and fumes that aggravate your breathing.  · Use oxygen therapy and pulmonary rehabilitation if directed by your health care provider. If you require home oxygen therapy, ask your healthcare provider whether you should purchase a pulse oximeter to measure your oxygen level at home.    · Avoid contact with individuals who have a contagious illness.  · Avoid extreme temperature and humidity changes.  · Eat healthy foods. Eating smaller, more frequent meals and resting before meals may help you maintain your strength.  · Stay active, but balance activity with periods of rest. Exercise and physical activity will help you maintain your ability to do things you want to do.  · Preventing infection and hospitalization is very important when you have COPD. Make sure to receive all the vaccines your health care provider recommends, especially the pneumococcal and influenza vaccines. Ask your healthcare provider whether you   in (inhaling) through your nose for 1 second. Then, purse your lips as if you were going to whistle and breathe out (exhale)  through the pursed lips for 2 seconds.   Diaphragmatic breathing. Start by putting one hand on your abdomen just above your waist. Inhale slowly through your nose. The hand on your abdomen should move out. Then purse your lips and exhale slowly. You should be able to feel the hand on your abdomen moving in as you exhale.   Learn and use controlled coughing to clear mucus from your lungs. Controlled coughing is a series of short, progressive coughs. The steps of controlled coughing are:  1. Lean your head slightly forward.  2. Breathe in deeply using diaphragmatic breathing.  3. Try to hold your breath for 3 seconds.  4. Keep your mouth slightly open while coughing twice.  5. Spit any mucus out into a tissue.  6. Rest and repeat the steps once or twice as needed. SEEK MEDICAL CARE IF:   You are coughing up more mucus than usual.   There is a change in the color or thickness of your mucus.   Your breathing is more labored than usual.   Your breathing is faster than usual.  SEEK IMMEDIATE MEDICAL CARE IF:   You have shortness of breath while you are resting.   You have shortness of breath that prevents you from:  Being able to talk.   Performing your usual physical activities.   You have chest pain lasting longer than 5 minutes.   Your skin color is more cyanotic than usual.  You measure low oxygen saturations for longer than 5 minutes with a pulse oximeter. MAKE SURE YOU:   Understand these instructions.  Will watch your condition.  Will get help right away if you are not doing well or get worse. Document Released: 08/13/2005 Document Revised: 08/24/2013 Document Reviewed: 06/30/2013 [facility] Patient Information 2014 [place], [place].  Prednisone tablets What is this medicine? PREDNISONE (PRED ni sone) is a corticosteroid. It is commonly used to treat inflammation of the skin, joints, lungs, and other organs. Common conditions treated include asthma,  allergies, and arthritis. It is also used for other conditions, such as blood disorders and diseases of the adrenal glands. This medicine may be used for other purposes; ask your health care provider or pharmacist if you have questions. COMMON BRAND NAME(S): Deltasone, Predone, Sterapred DS, Sterapred What should I tell my health care provider before I take this medicine? They need to know if you have any of these conditions: -Cushing's syndrome -diabetes -glaucoma -heart disease -high blood pressure -infection (especially a virus infection such as chickenpox, cold sores, or herpes) -kidney disease -liver disease -mental illness -myasthenia gravis -osteoporosis -seizures -stomach or intestine problems -thyroid disease -an unusual or allergic reaction to lactose, prednisone, other medicines, foods, dyes, or preservatives -pregnant or trying to get pregnant -breast-feeding How should I use this medicine? Take this medicine by mouth with a glass of water. Follow the directions on the prescription label. Take this medicine with food. If you are taking this medicine once a day, take it in the morning. Do not take more medicine than you are told to take. Do not suddenly stop taking your medicine because you may develop a severe reaction. Your doctor will tell you how much medicine to take. If your doctor wants you to stop the medicine, the dose may be slowly lowered over time to avoid any side effects. Talk to your pediatrician regarding the  use of this medicine in children. Special care may be needed. Overdosage: If you think you have taken too much of this medicine contact a poison control center or emergency room at once. NOTE: This medicine is only for you. Do not share this medicine with others. What if I miss a dose? If you miss a dose, take it as soon as you can. If it is almost time for your next dose, talk to your doctor or health care professional. You may need to miss a dose or take  an extra dose. Do not take double or extra doses without advice. What may interact with this medicine? Do not take this medicine with any of the following medications: -metyrapone -mifepristone This medicine may also interact with the following medications: -aminoglutethimide -amphotericin B -aspirin and aspirin-like medicines -barbiturates -certain medicines for diabetes, like glipizide or glyburide -cholestyramine -cholinesterase inhibitors -cyclosporine -digoxin -diuretics -ephedrine -male hormones, like estrogens and birth control pills -isoniazid -ketoconazole -NSAIDS, medicines for pain and inflammation, like ibuprofen or naproxen -phenytoin -rifampin -toxoids -vaccines -warfarin This list may not describe all possible interactions. Give your health care provider a list of all the medicines, herbs, non-prescription drugs, or dietary supplements you use. Also tell them if you smoke, drink alcohol, or use illegal drugs. Some items may interact with your medicine. What should I watch for while using this medicine? Visit your doctor or health care professional for regular checks on your progress. If you are taking this medicine over a prolonged period, carry an identification card with your name and address, the type and dose of your medicine, and your doctor's name and address. This medicine may increase your risk of getting an infection. Tell your doctor or health care professional if you are around anyone with measles or chickenpox, or if you develop sores or blisters that do not heal properly. If you are going to have surgery, tell your doctor or health care professional that you have taken this medicine within the last twelve months. Ask your doctor or health care professional about your diet. You may need to lower the amount of salt you eat. This medicine may affect blood sugar levels. If you have diabetes, check with your doctor or health care professional before you change  your diet or the dose of your diabetic medicine. What side effects may I notice from receiving this medicine? Side effects that you should report to your doctor or health care professional as soon as possible: -allergic reactions like skin rash, itching or hives, swelling of the face, lips, or tongue -changes in emotions or moods -changes in vision -depressed mood -eye pain -fever or chills, cough, sore throat, pain or difficulty passing urine -increased thirst -swelling of ankles, feet Side effects that usually do not require medical attention (report to your doctor or health care professional if they continue or are bothersome): -confusion, excitement, restlessness -headache -nausea, vomiting -skin problems, acne, thin and shiny skin -trouble sleeping -weight gain This list may not describe all possible side effects. Call your doctor for medical advice about side effects. You may report side effects to FDA at 1-800-FDA-1088. Where should I keep my medicine? Keep out of the reach of children. Store at room temperature between 15 and 30 degrees C (59 and 86 degrees F). Protect from light. Keep container tightly closed. Throw away any unused medicine after the expiration date. NOTE: This sheet is a summary. It may not cover all possible information. If you have questions about this medicine, talk to  your doctor, pharmacist, or health care provider.  2014, [employer]. (2011-06-19 10:57:14)

## 2014-06-19 ENCOUNTER — Ambulatory Visit
Admission: RE | Admit: 2014-06-19 | Discharge: 2014-06-19 | Disposition: A | Discharge status: Home / Self Care | Payer: Medicare (Managed Care) | Source: Ambulatory Visit | Attending: *Deleted | Admitting: *Deleted

## 2014-06-19 ENCOUNTER — Other Ambulatory Visit: Payer: Self-pay | Admitting: *Deleted

## 2014-06-19 DIAGNOSIS — T148XXA Other injury of unspecified body region, initial encounter: Secondary | ICD-10-CM

## 2014-07-28 ENCOUNTER — Inpatient Hospital Stay (HOSPITAL_COMMUNITY)
Admission: AD | Admit: 2014-07-28 | Discharge: 2014-07-31 | DRG: 603 | Disposition: A | Discharge status: Home / Self Care | Payer: Medicare (Managed Care) | Source: Ambulatory Visit | Attending: Internal Medicine | Admitting: Internal Medicine

## 2014-07-28 ENCOUNTER — Inpatient Hospital Stay (HOSPITAL_COMMUNITY): Payer: Medicare (Managed Care)

## 2014-07-28 DIAGNOSIS — E785 Hyperlipidemia, unspecified: Secondary | ICD-10-CM | POA: Diagnosis present

## 2014-07-28 DIAGNOSIS — Z87891 Personal history of nicotine dependence: Secondary | ICD-10-CM

## 2014-07-28 DIAGNOSIS — L02619 Cutaneous abscess of unspecified foot: Secondary | ICD-10-CM | POA: Diagnosis present

## 2014-07-28 DIAGNOSIS — R410 Disorientation, unspecified: Secondary | ICD-10-CM

## 2014-07-28 DIAGNOSIS — L03039 Cellulitis of unspecified toe: Principal | ICD-10-CM | POA: Diagnosis present

## 2014-07-28 DIAGNOSIS — F0280 Dementia in other diseases classified elsewhere without behavioral disturbance: Secondary | ICD-10-CM | POA: Diagnosis present

## 2014-07-28 DIAGNOSIS — L03116 Cellulitis of left lower limb: Secondary | ICD-10-CM | POA: Diagnosis present

## 2014-07-28 DIAGNOSIS — I159 Secondary hypertension, unspecified: Secondary | ICD-10-CM

## 2014-07-28 DIAGNOSIS — Z7982 Long term (current) use of aspirin: Secondary | ICD-10-CM

## 2014-07-28 DIAGNOSIS — J449 Chronic obstructive pulmonary disease, unspecified: Secondary | ICD-10-CM | POA: Diagnosis present

## 2014-07-28 DIAGNOSIS — N189 Chronic kidney disease, unspecified: Secondary | ICD-10-CM | POA: Diagnosis present

## 2014-07-28 DIAGNOSIS — J4489 Other specified chronic obstructive pulmonary disease: Secondary | ICD-10-CM | POA: Diagnosis present

## 2014-07-28 DIAGNOSIS — L97509 Non-pressure chronic ulcer of other part of unspecified foot with unspecified severity: Secondary | ICD-10-CM | POA: Diagnosis present

## 2014-07-28 DIAGNOSIS — I129 Hypertensive chronic kidney disease with stage 1 through stage 4 chronic kidney disease, or unspecified chronic kidney disease: Secondary | ICD-10-CM | POA: Diagnosis present

## 2014-07-28 DIAGNOSIS — Z794 Long term (current) use of insulin: Secondary | ICD-10-CM | POA: Diagnosis not present

## 2014-07-28 DIAGNOSIS — E1169 Type 2 diabetes mellitus with other specified complication: Secondary | ICD-10-CM | POA: Diagnosis present

## 2014-07-28 DIAGNOSIS — G309 Alzheimer's disease, unspecified: Secondary | ICD-10-CM | POA: Diagnosis present

## 2014-07-28 DIAGNOSIS — I872 Venous insufficiency (chronic) (peripheral): Secondary | ICD-10-CM | POA: Diagnosis present

## 2014-07-28 DIAGNOSIS — Z66 Do not resuscitate: Secondary | ICD-10-CM | POA: Diagnosis present

## 2014-07-28 LAB — CBC
HCT: 43.5 % (ref 39.0–52.0)
HEMOGLOBIN: 13.9 g/dL (ref 13.0–17.0)
MCH: 28.6 pg (ref 26.0–34.0)
MCHC: 32 g/dL (ref 30.0–36.0)
MCV: 89.5 fL (ref 78.0–100.0)
Platelets: 163 10*3/uL (ref 150–400)
RBC: 4.86 MIL/uL (ref 4.22–5.81)
RDW: 13.6 % (ref 11.5–15.5)
WBC: 5 10*3/uL (ref 4.0–10.5)

## 2014-07-28 LAB — COMPREHENSIVE METABOLIC PANEL
ALBUMIN: 3.4 g/dL — AB (ref 3.5–5.2)
ALT: 10 U/L (ref 0–53)
AST: 16 U/L (ref 0–37)
Alkaline Phosphatase: 55 U/L (ref 39–117)
Anion gap: 14 (ref 5–15)
BUN: 15 mg/dL (ref 6–23)
CO2: 27 meq/L (ref 19–32)
Calcium: 8.8 mg/dL (ref 8.4–10.5)
Chloride: 101 mEq/L (ref 96–112)
Creatinine, Ser: 1.35 mg/dL (ref 0.50–1.35)
GFR calc Af Amer: 53 mL/min — ABNORMAL LOW (ref >90)
GFR, EST NON AFRICAN AMERICAN: 46 mL/min — AB (ref >90)
Glucose, Bld: 124 mg/dL — ABNORMAL HIGH (ref 70–99)
POTASSIUM: 3.5 meq/L — AB (ref 3.7–5.3)
SODIUM: 142 meq/L (ref 137–147)
Total Bilirubin: 0.3 mg/dL (ref 0.3–1.2)
Total Protein: 7 g/dL (ref 6.0–8.3)

## 2014-07-28 LAB — GLUCOSE, CAPILLARY
Glucose-Capillary: 132 mg/dL — ABNORMAL HIGH (ref 70–99)
Glucose-Capillary: 94 mg/dL (ref 70–99)

## 2014-07-28 LAB — MAGNESIUM: Magnesium: 1.9 mg/dL (ref 1.5–2.5)

## 2014-07-28 MED ORDER — DONEPEZIL HCL 10 MG PO TABS
10.0000 mg | ORAL_TABLET | Freq: Every day | ORAL | Status: DC
  Administered 2014-07-28 – 2014-07-30 (×3): 10 mg via ORAL
  Filled 2014-07-28 (×4): qty 1

## 2014-07-28 MED ORDER — HEPARIN SODIUM (PORCINE) 5000 UNIT/ML IJ SOLN
5000.0000 [IU] | Freq: Three times a day (TID) | INTRAMUSCULAR | Status: DC
  Administered 2014-07-28 – 2014-07-31 (×5): 5000 [IU] via SUBCUTANEOUS
  Filled 2014-07-28 (×10): qty 1

## 2014-07-28 MED ORDER — HYDROCODONE-ACETAMINOPHEN 7.5-325 MG PO TABS
1.0000 | ORAL_TABLET | Freq: Three times a day (TID) | ORAL | Status: DC | PRN
  Administered 2014-07-28 – 2014-07-30 (×5): 1 via ORAL
  Filled 2014-07-28 (×7): qty 1

## 2014-07-28 MED ORDER — INSULIN ASPART 100 UNIT/ML ~~LOC~~ SOLN: 0.0000 [IU] | Freq: Every day | SUBCUTANEOUS | Status: DC

## 2014-07-28 MED ORDER — VANCOMYCIN HCL 10 G IV SOLR
1500.0000 mg | INTRAVENOUS | Status: DC
  Administered 2014-07-30 (×2): 1500 mg via INTRAVENOUS
  Filled 2014-07-28 (×3): qty 1500

## 2014-07-28 MED ORDER — ASPIRIN 81 MG PO CHEW
81.0000 mg | CHEWABLE_TABLET | Freq: Every day | ORAL | Status: DC
  Administered 2014-07-28 – 2014-07-31 (×4): 81 mg via ORAL
  Filled 2014-07-28 (×4): qty 1

## 2014-07-28 MED ORDER — ALBUTEROL SULFATE (2.5 MG/3ML) 0.083% IN NEBU: 2.5000 mg | INHALATION_SOLUTION | Freq: Four times a day (QID) | RESPIRATORY_TRACT | Status: DC | PRN

## 2014-07-28 MED ORDER — SENNOSIDES-DOCUSATE SODIUM 8.6-50 MG PO TABS
2.0000 | ORAL_TABLET | Freq: Every morning | ORAL | Status: DC
  Administered 2014-07-29 – 2014-07-31 (×3): 2 via ORAL
  Filled 2014-07-28 (×3): qty 2

## 2014-07-28 MED ORDER — VANCOMYCIN HCL 10 G IV SOLR
2000.0000 mg | Freq: Once | INTRAVENOUS | Status: AC
  Administered 2014-07-28: 2000 mg via INTRAVENOUS
  Filled 2014-07-28 (×2): qty 2000

## 2014-07-28 MED ORDER — INSULIN ASPART 100 UNIT/ML ~~LOC~~ SOLN
0.0000 [IU] | Freq: Three times a day (TID) | SUBCUTANEOUS | Status: DC
  Administered 2014-07-28: 1 [IU] via SUBCUTANEOUS

## 2014-07-28 MED ORDER — POTASSIUM CHLORIDE CRYS ER 20 MEQ PO TBCR
40.0000 meq | EXTENDED_RELEASE_TABLET | Freq: Once | ORAL | Status: AC
  Administered 2014-07-28: 40 meq via ORAL
  Filled 2014-07-28 (×2): qty 2

## 2014-07-28 NOTE — H&P (Signed)
Date: 07/28/2014               Patient Name:  Austin Krause MRN: [id number]  DOB: 09-Jul-1925 Age / Sex: 80 y.o., male   PCP: [name], MD         Medical Service: Internal Medicine Teaching Service         Attending Physician:  Axel Filler, MD    First Contact: Venita Lick, MD Pager: [phone]  Second Contact: Clayburn Pert, MD Pager: [phone]       After Hours (After 5p/  First Contact Pager: [phone]  weekends / holidays): Second Contact Pager: [phone]   Chief Complaint: left foot pain with ulcer that has failed outpatient antibiotics  History of Present Illness:  Mr. Leu is an 80 yo man who is a [facility] patient with HTN, DM, PAD, hyperlipidemia, smoking history (now quit), COPD, CKD and dementia who is here for evaluation of left foot ulcers on the 4th and 5th toes. He received outpatient treatment with 20 days of dual antibiotics (ciprofloxacin and doxycycline), but his lesions persisted. He is unable to provide a history of his pain or the lesions.   Left foot and ankle xrays in 06/2014 showed no evidence of fracture or osteomyelitis, only positive for vascular calcifications and severe pes planus.  Meds: Current Facility-Administered Medications  Medication Dose Route Frequency Provider Last Rate Last Dose  . albuterol (PROVENTIL) (2.5 MG/3ML) 0.083% nebulizer solution 2.5 mg  2.5 mg Nebulization Q6H PRN Otho Bellows, MD      . aspirin chewable tablet 81 mg  81 mg Oral Daily Otho Bellows, MD   81 mg at 07/28/14 1809  . donepezil (ARICEPT) tablet 10 mg  10 mg Oral QHS Otho Bellows, MD      . heparin injection 5,000 Units  5,000 Units Subcutaneous 3 times per day Otho Bellows, MD   5,000 Units at 07/28/14 1826  . HYDROcodone-acetaminophen (NORCO) 7.5-325 MG per tablet 1 tablet  1 tablet Oral TID PRN Otho Bellows, MD      . insulin aspart (novoLOG) injection 0-5 Units  0-5 Units Subcutaneous QHS Otho Bellows, MD      . insulin aspart (novoLOG)  injection 0-9 Units  0-9 Units Subcutaneous TID WC Otho Bellows, MD   1 Units at 07/28/14 1809  . [START ON 07/29/2014] senna-docusate (Senokot-S) tablet 2 tablet  2 tablet Oral q morning - 10a Otho Bellows, MD      . vancomycin [facility]) 2,000 mg in sodium chloride 0.9 % 500 mL IVPB  2,000 mg Intravenous Once [place], [facility]        Allergies: Allergies as of 07/28/2014  . (No Known Allergies)   Past Medical History  Diagnosis Date  . COPD (chronic obstructive pulmonary disease)   . HTN (hypertension)   . Hyperlipidemia   . CKD (chronic kidney disease)    Past Surgical History  Procedure Laterality Date  . Cholecystectomy     No family history on file. History   Social History  . Marital Status: Widowed    Spouse Name: N/A    Number of Children: N/A  . Years of Education: N/A   Occupational History  . Not on file.   Social History Main Topics  . Smoking status: Former Research scientist (life sciences)  . Smokeless tobacco: Not on file  . Alcohol Use: No  . Drug Use: No  . Sexual Activity: Not Currently   Other Topics  Concern  . Not on file   Social History Narrative  . No narrative on file    Review of Systems: General: difficult to obtain a history due to the patient's dementia, but no history of recent illness per [facility] Skin: denies other lesions or rashes HEENT: no recent history of trauma, no headaches Cardiac: no chest pain, no palpitations, history of PAD, but unable to elicit recent history of pain Respiratory: no SOB GI: no abdominal pain or changes in BMs Urinary: no pain on urination Vascular: history of PAD; patient  Msk: chronic knee pain, left foot pain Psychiatric: h/o dementia  Physical Exam: Blood pressure 114/69, pulse 92, temperature 98.2 F (36.8 C), temperature source Oral, resp. rate 16, SpO2 100.00%. Exam limited by patient's pain / moaning / screams Appearance: sitting up in chair in NAD HEENT: AT/[place], mild conjunctival erythema OU, hearing intact,  no lymphadenopathy Heart: RRR, normal S1S2 Lungs: CTAB Abdomen: BS+, nontender Extremities: hyperpigmentation c/w chronic venous stasis b/l, very dry skin, 1 cm wide circular ulcer that is erythematous with white clearing in center on lateral part of 5th toe. Another, 0.25 cm ulcer proximal and medial to previously described lesion. Feet are warm, left foot is very tender to palpation. DPs intact (R stronger than L). 3+ edema in both feet, extends to mid-shin. Neurologic: Convoluted thought process, sensation and strength intact throughout, unable to move toes, but may have been volitional  Skin: no other lesions Initial evaluation 07/28/14:  Initial evaluation 07/28/14:     CBG:  Recent Labs  07/28/14 1802  GLUCAP 132*    Assessment & Plan by Problem: Active Problems:   Cellulitis of foot, left  Mr. Farnan is an 80 yo man with a history of PAD who is presenting with two ulcers on his left 5th toe that have been treated with outpatient antibiotics. Foot and ankle xrays one month ago showed vascular calcifications but no signs of osteomyelitis.  Left Foot Ulcers with History of PAD: - ABIs - Initiated vancomycin - Norco for pain PRN - MRI left foot without contrast - Consulted vascular - CBC and blood cultures - ESR and CRP to evaluate for acute infection  DMII: - SSI  Hypertension: normotensive in hospital - Hold BP meds (Benazepril 20 mg daily and amlodipine 4 mg daily)  Hyperlipidemia: currently not on medications  CKD: - BMET, CMET  COPD: - home albuterol  Dementia (presumed to be Alzheimer's) and Difficulty with Sleep: - home donepezil - holding trazadone for now  Diet: - heart healthy, then NPO at midnight  DVT Ppx: - [place] heparin   Dispo: Disposition is deferred at this time, awaiting improvement of current medical problems. Anticipated discharge in approximately 3 day(s).   The patient does have a current PCP [name], MD) and does not need  an [facility] hospital follow-up appointment after discharge.  The patient does have transportation limitations that hinder transportation to clinic appointments.  Signed: Drucilla Schmidt, MD 07/28/2014, 7:08 PM

## 2014-07-28 NOTE — Progress Notes (Signed)
ANTIBIOTIC CONSULT NOTE - INITIAL  Pharmacy Consult:  Vancomycin Indication:  Cellulitis  No Known Allergies  Patient Measurements: Height: 6' 0.05" (183 cm) IBW/kg (Calculated) : 77.71 Weight from Jan 2015 = 102.1 kg  Vital Signs: Temp: 98.2 F (36.8 C) (09/11 1637) Temp src: Oral (09/11 1637) BP: 114/69 mmHg (09/11 1637) Pulse Rate: 92 (09/11 1637)  Labs:  Recent Labs  07/28/14 1908  WBC 5.0  HGB 13.9  PLT 163  CREATININE 1.35   The CrCl is unknown because both a height and weight (above a minimum accepted value) are required for this calculation. No results found for this basename: VANCOTROUGH, VANCOPEAK, VANCORANDOM, GENTTROUGH, GENTPEAK, GENTRANDOM, TOBRATROUGH, TOBRAPEAK, TOBRARND, AMIKACINPEAK, AMIKACINTROU, AMIKACIN,  in the last 72 hours   Microbiology: No results found for this or any previous visit (from the past 720 hour(s)).  Medical History: Past Medical History  Diagnosis Date  . COPD (chronic obstructive pulmonary disease)   . HTN (hypertension)   . Hyperlipidemia   . CKD (chronic kidney disease)       Assessment: 13 YOM admitted with complaint of left foot pain and ulcer that has failed outpatient Cipro and doxycycline.  Pharmacy consulted to initiate vancomycin.  Baseline labs reviewed.   Goal of Therapy:  Vancomycin trough level 10-15 mcg/ml   Plan:  - Vanc 2gm IV x 1 (ordered for 1530), then  IV Q24H - Monitor renal fxn, clinical progress, vanc trough as indicated - F/U with patient's updated height and weight - F/U MRI, KCL supplementation    Millard Bautch D. Laney Potash, PharmD, BCPS Pager:  [phone] 07/28/2014, 9:00 PM

## 2014-07-28 NOTE — Progress Notes (Signed)
Nurse Tech attempted to get pt's CBG upon pt's return to unit from MRI. Pt unwilling at this time. This RN unable to obtain as well, therefore unable to administer bedtime insulin. Will re-attempt once pt has settled into room and is more comfortable with staff.

## 2014-07-29 DIAGNOSIS — E785 Hyperlipidemia, unspecified: Secondary | ICD-10-CM

## 2014-07-29 DIAGNOSIS — I739 Peripheral vascular disease, unspecified: Secondary | ICD-10-CM

## 2014-07-29 DIAGNOSIS — L98499 Non-pressure chronic ulcer of skin of other sites with unspecified severity: Secondary | ICD-10-CM

## 2014-07-29 DIAGNOSIS — L97509 Non-pressure chronic ulcer of other part of unspecified foot with unspecified severity: Secondary | ICD-10-CM

## 2014-07-29 DIAGNOSIS — F0390 Unspecified dementia without behavioral disturbance: Secondary | ICD-10-CM

## 2014-07-29 DIAGNOSIS — E119 Type 2 diabetes mellitus without complications: Secondary | ICD-10-CM

## 2014-07-29 DIAGNOSIS — N189 Chronic kidney disease, unspecified: Secondary | ICD-10-CM

## 2014-07-29 DIAGNOSIS — L97409 Non-pressure chronic ulcer of unspecified heel and midfoot with unspecified severity: Secondary | ICD-10-CM

## 2014-07-29 DIAGNOSIS — I129 Hypertensive chronic kidney disease with stage 1 through stage 4 chronic kidney disease, or unspecified chronic kidney disease: Secondary | ICD-10-CM

## 2014-07-29 DIAGNOSIS — J449 Chronic obstructive pulmonary disease, unspecified: Secondary | ICD-10-CM

## 2014-07-29 LAB — BASIC METABOLIC PANEL
Anion gap: 12 (ref 5–15)
BUN: 13 mg/dL (ref 6–23)
CHLORIDE: 105 meq/L (ref 96–112)
CO2: 27 mEq/L (ref 19–32)
CREATININE: 1.32 mg/dL (ref 0.50–1.35)
Calcium: 8.8 mg/dL (ref 8.4–10.5)
GFR, EST AFRICAN AMERICAN: 54 mL/min — AB (ref >90)
GFR, EST NON AFRICAN AMERICAN: 47 mL/min — AB (ref >90)
Glucose, Bld: 70 mg/dL (ref 70–99)
Potassium: 3.5 mEq/L — ABNORMAL LOW (ref 3.7–5.3)
Sodium: 144 mEq/L (ref 137–147)

## 2014-07-29 LAB — GLUCOSE, CAPILLARY
GLUCOSE-CAPILLARY: 69 mg/dL — AB (ref 70–99)
GLUCOSE-CAPILLARY: 97 mg/dL (ref 70–99)
GLUCOSE-CAPILLARY: 70 mg/dL (ref 70–99)
GLUCOSE-CAPILLARY: 109 mg/dL — AB (ref 70–99)
Glucose-Capillary: 86 mg/dL (ref 70–99)

## 2014-07-29 LAB — CBC
HCT: 43 % (ref 39.0–52.0)
HEMOGLOBIN: 13.8 g/dL (ref 13.0–17.0)
MCH: 28.2 pg (ref 26.0–34.0)
MCHC: 32.1 g/dL (ref 30.0–36.0)
MCV: 87.9 fL (ref 78.0–100.0)
Platelets: 137 10*3/uL — ABNORMAL LOW (ref 150–400)
RBC: 4.89 MIL/uL (ref 4.22–5.81)
RDW: 13.6 % (ref 11.5–15.5)
WBC: 6.2 10*3/uL (ref 4.0–10.5)

## 2014-07-29 LAB — HEMOGLOBIN A1C
Hgb A1c MFr Bld: 7.3 % — ABNORMAL HIGH (ref <5.7)
Mean Plasma Glucose: 163 mg/dL — ABNORMAL HIGH (ref <117)

## 2014-07-29 LAB — C-REACTIVE PROTEIN: CRP: 1 mg/dL — AB (ref <0.60)

## 2014-07-29 LAB — SEDIMENTATION RATE: SED RATE: 5 mm/h (ref 0–16)

## 2014-07-29 MED ORDER — LORAZEPAM 2 MG/ML IJ SOLN
0.2500 mg | Freq: Once | INTRAMUSCULAR | Status: DC
  Filled 2014-07-29 (×2): qty 1

## 2014-07-29 MED ORDER — DEXTROSE 50 % IV SOLN
INTRAVENOUS | Status: AC
Start: 2014-07-29 — End: 2014-07-29
  Administered 2014-07-29: 25 mL
  Filled 2014-07-29: qty 50

## 2014-07-29 MED ORDER — PIPERACILLIN-TAZOBACTAM 3.375 G IVPB
3.3750 g | Freq: Three times a day (TID) | INTRAVENOUS | Status: DC
  Administered 2014-07-29 – 2014-07-30 (×5): 3.375 g via INTRAVENOUS
  Filled 2014-07-29 (×9): qty 50

## 2014-07-29 NOTE — H&P (Signed)
Internal Medicine On-Call Attending Admission Note Date: 07/29/2014  Patient name: Austin Krause Medical record number: [id number] Date of birth: 06-08-1925 Age: 80 y.o. Gender: male  I saw and evaluated the patient. I reviewed the resident's note and I agree with the resident's findings and plan as documented in the resident's note, with the following additional comments.  Chief Complaint(s): Left foot pain  History - key components related to admission: Patient is an 80 year old man with history of hypertension, diabetes mellitus, peripheral vascular disease, atherosclerotic disease of the aortic, COPD, hyperlipidemia, dementia, CKD, and other problems as outlined in the medical history, patient of [facility], admitted with left foot pain and swelling with ulcerations on the fifth toe which did not improve despite 20 days of oral outpatient antibiotics.    Physical Exam - key components related to admission:  Filed Vitals:   07/28/14 1900 07/28/14 2144 07/28/14 2151 07/29/14 0600  BP:   125/67 150/62  Pulse:   102 84  Temp:   98.2 F (36.8 C) 97.8 F (36.6 C)  TempSrc:      Resp:   18 18  Height: 6' 0.05" (1.83 m)     Weight:  221 lb 12.5 oz (100.6 kg)    SpO2:   97% 99%    General: Alert, disoriented, complains of left foot pain Lungs: Clear Heart: Regular; no extra sounds or murmurs Abdomen: Bowel sounds present, soft, nontender Extremities: There is erythema, swelling, and tenderness of the left foot especially distally; there are small ulcers on the dorsum of the left fifth toe Pulses: I am unable to palpate DP pulses   Lab results:   Basic Metabolic Panel:  Recent Labs  40/98/11 1908 07/29/14 0655  NA 142 144  K 3.5* 3.5*  CL 101 105  CO2 27 27  GLUCOSE 124* 70  BUN 15 13  CREATININE 1.35 1.32  CALCIUM 8.8 8.8  MG 1.9  --     Liver Function Tests:  Recent Labs  07/28/14 1908  AST 16  ALT 10  ALKPHOS 55  BILITOT 0.3  PROT 7.0  ALBUMIN 3.4*     CBC:  Recent Labs  07/28/14 1908 07/29/14 0655  WBC 5.0 6.2  HGB 13.9 13.8  HCT 43.5 43.0  MCV 89.5 87.9  PLT 163 137*    CBG:  Recent Labs  07/28/14 1802 07/28/14 2314 07/29/14 0705 07/29/14 0834  GLUCAP 132* 94 69* 97    Hemoglobin A1C:  Recent Labs  07/28/14 1908  HGBA1C 7.3*     Assessment & Plan by Problem:  1.  Left foot infection with associated ulceration of left fifth toe, in the setting of peripheral vascular disease.  Symptoms have not improved with oral antibiotic treatment.  Plan is empiric broad-spectrum IV antibiotics; pain control; MRI to assess; vascular surgery consult.  2.  Peripheral vascular disease.  Prior lower extremity arterial Dopplers in 2011 showed a moderate reduction in arterial flow on the right and a moderate to severe reduction on the left; Doppler waveforms were severely abnormal or absent which would indicate a possible elevation in pressure due to calcification.  Plan is repeat lower extremity arterial Dopplers; vascular surgery consult as above.  3.  Dementia.  Plan is continue donepezil; supportive care; sitter.  4.  Other problems and plans as per the resident physician's note.  5.  Dr. Heide Spark will return on Monday 07/31/2014.

## 2014-07-29 NOTE — Consult Note (Addendum)
[facility] consult requested prior to [facility] involvement.  They are now following for assessment and plan of care for left foot. Please re-consult if further assistance is needed.  Thank-you,  Cammie Mcgee MSN, RN, CWOCN, [place], CNS [phone]

## 2014-07-29 NOTE — Progress Notes (Signed)
ANTIBIOTIC CONSULT NOTE - FOLLOW UP  Pharmacy Consult for Zosyn + Vanc Indication: cellulitis  No Known Allergies  Patient Measurements: Height: 6' 0.05" (183 cm) Weight: 221 lb 12.5 oz (100.6 kg) IBW/kg (Calculated) : 77.71  Vital Signs: Temp: 97.8 F (36.6 C) (09/12 0600) BP: 150/62 mmHg (09/12 0600) Pulse Rate: 84 (09/12 0600) Intake/Output from previous day:   Intake/Output from this shift:    Labs:  Recent Labs  07/28/14 1908 07/29/14 0655  WBC 5.0 6.2  HGB 13.9 13.8  PLT 163 137*  CREATININE 1.35 1.32   Estimated Creatinine Clearance: 48.5 ml/min (by C-G formula based on Cr of 1.32). No results found for this basename: VANCOTROUGH, VANCOPEAK, VANCORANDOM, GENTTROUGH, GENTPEAK, GENTRANDOM, TOBRATROUGH, TOBRAPEAK, TOBRARND, AMIKACINPEAK, AMIKACINTROU, AMIKACIN,  in the last 72 hours   Microbiology: No results found for this or any previous visit (from the past 720 hour(s)).  Anti-infectives   Start     Dose/Rate Route Frequency Ordered Stop   07/29/14 2300  vancomycin (VANCOCIN) 1,500 mg in sodium chloride 0.9 % 500 mL IVPB     1,500 mg 250 mL/hr over 120 Minutes Intravenous Every 24 hours 07/28/14 2101     07/28/14 1730  vancomycin (VANCOCIN) 2,000 mg in sodium chloride 0.9 % 500 mL IVPB     2,000 mg 250 mL/hr over 120 Minutes Intravenous  Once 07/28/14 1728 07/29/14 0129      Assessment: 80 yo M admitted 9/11 with left foot cellulitis after failing ciprofloxacin and doxycyclin outpatient. Pharmacy initially consulted to start Vancomycin, now adding Zosyn. WBC 6.2 (up from 5.0), afebrile, SCr 1.35, normalized CrCl 39 mL/min, producing urine (amount not recorded).    Goal of Therapy:  Vancomycin trough level 10-15 mcg/ml Eradication of infection  Plan:  Begin Zosyn 3.375g IV q8h extended infusion Continue Vanc  IV q24h Monitor renal fxn, cultures, and clinical progress Vanc trough at steady state if clinically warranted  Thank you for allowing  pharmacy to be part of this patient's care team  Prosper Paff M. Dominick Zertuche, Pharm.D Clinical Pharmacy Resident Pager: [phone] 07/29/2014 .8:55 AM

## 2014-07-29 NOTE — Progress Notes (Signed)
Hypoglycemic Event  CBG: 69  Treatment: D50 IV 25 mL  Symptoms: unable to assess, pt has dementia, unable to express  Follow-up CBG: Time 0830 CBG Result: 97  Possible Reasons for Event: NPO Comments/MD notified:yes    Austin Krause A  Remember to initiate Hypoglycemia Order Set & complete

## 2014-07-29 NOTE — Progress Notes (Signed)
Internal Medicine Attending  Date: 07/29/2014  Patient name: Austin Krause Medical record number: [id number] Date of birth: 17-Dec-1924 Age: 80 y.o. Gender: male  I saw and evaluated the patient, and discussed his care with resident on A.M rounds.  I reviewed the resident's note by Dr. Leatha Gilding and I agree with the resident's findings and plans as documented in her note.

## 2014-07-29 NOTE — Progress Notes (Addendum)
VASCULAR LAB PRELIMINARY  PRELIMINARY  PRELIMINARY  PRELIMINARY   VASCULAR LAB PRELIMINARY  ARTERIAL  ABI completed: ABI may be falsely elevated secondary to calcified vessels. Study was technically difficult secondary to patient's pain and constant movement and refusal to roll off side.  Unable to even attempt ABI in PT.    RIGHT    LEFT    PRESSURE WAVEFORM  PRESSURE WAVEFORM  BRACHIAL   BRACHIAL 151 T  DP 158 DM DP 160 DM  AT   AT    PT   PT    PER   PER    GREAT TOE  NA GREAT TOE  NA    RIGHT LEFT  ABI >1 >1     Austin Krause, RVT 07/29/2014, 3:28 PM   Austin Krause, RVT 07/29/2014, 3:28 PM

## 2014-07-29 NOTE — Consult Note (Signed)
CONSULT NOTE   MRN : [id number]  Reason for Consult: Left foot 4th and 5th digit ulcers  Referring Physician:   History of Present Illness: 80 y/o male with dementia unable to communicate a history of events leading to these lesions.  Left foot edema and 4th and 5th toe ulcer with erythema and painful response to touch.  Feet are warm to touch. He received outpatient treatment with 20 days of dual antibiotics (ciprofloxacin and doxycycline), but his lesions persisted.  Documented past medical history includes:  Hypertension, hyperlipidemia, COPD, DM and dementia.     Addendum: per family this patient's left foot wounds have been improving and the breakdown down cyclically.  Pt ambulates on his own.  No intermittent claudication or rest pain noted.   Current Facility-Administered Medications  Medication Dose Route Frequency Provider Last Rate Last Dose  . albuterol (PROVENTIL) (2.5 MG/3ML) 0.083% nebulizer solution 2.5 mg  2.5 mg Nebulization Q6H PRN Genelle Gather, MD      . aspirin chewable tablet 81 mg  81 mg Oral Daily Genelle Gather, MD   81 mg at 07/28/14 1809  . donepezil (ARICEPT) tablet 10 mg  10 mg Oral QHS Genelle Gather, MD   10 mg at 07/28/14 2258  . heparin injection 5,000 Units  5,000 Units Subcutaneous 3 times per day Genelle Gather, MD   5,000 Units at 07/28/14 1826  . HYDROcodone-acetaminophen (NORCO) 7.5-325 MG per tablet 1 tablet  1 tablet Oral TID PRN Genelle Gather, MD   1 tablet at 07/28/14 2025  . insulin aspart (novoLOG) injection 0-5 Units  0-5 Units Subcutaneous QHS Genelle Gather, MD      . insulin aspart (novoLOG) injection 0-9 Units  0-9 Units Subcutaneous TID WC Genelle Gather, MD   1 Units at 07/28/14 1809  . piperacillin-tazobactam (ZOSYN) IVPB 3.375 g  3.375 g Intravenous 3 times per day Avel Peace, RPH      . senna-docusate (Senokot-S) tablet 2 tablet  2 tablet Oral q morning - 10a Genelle Gather, MD      . vancomycin [facility]) 1,500 mg in  sodium chloride 0.9 % 500 mL IVPB  1,500 mg Intravenous Q24H Thuy Dien Dang, RPH        Pt meds include: Statin :No Betablocker: No ASA: Yes Other anticoagulants/antiplatelets:   Past Medical History  Diagnosis Date  . COPD (chronic obstructive pulmonary disease)   . HTN (hypertension)   . Hyperlipidemia   . CKD (chronic kidney disease)     Past Surgical History  Procedure Laterality Date  . Cholecystectomy      Social History History  Substance Use Topics  . Smoking status: Former Games developer  . Smokeless tobacco: Not on file  . Alcohol Use: No    Family History No family history on file.  Patient unable to communicate history  No Known Allergies   REVIEW OF SYSTEMS  General:  Weight loss,  Fever,  chills Neurologic:  Dizziness,  Blackouts,  Seizure  Stroke,  "Mini stroke",  Slurred speech,  Temporary blindness;  weakness in arms or legs,  Hoarseness  Dysphagia Cardiac:  Chest pain/pressure,  Shortness of breath at rest  Shortness of breath with exertion,  Atrial fibrillation or irregular heartbeat  Vascular:  Pain in legs with walking,  Pain in legs at rest,  Pain in legs  at night,   Non-healing ulcer,  Blood clot in vein/DVT,   Pulmonary:  Home oxygen,  Productive cough,  Coughing up blood,  Asthma,   Wheezing  COPD Musculoskeletal:   Arthritis,  Low back pain,  Joint pain Hematologic:  Easy Bruising,  Anemia;  Hepatitis Gastrointestinal:  Blood in stool,  Gastroesophageal Reflux/heartburn, Urinary: [x ] chronic Kidney disease,  on HD -  MWF or  TTHS,  Burning with urination,  Difficulty urinating Skin:  Rashes, [x ] Wounds Psychological:  Anxiety,  Depression  dementia   Physical Examination Filed Vitals:   07/28/14 1900 07/28/14 2144 07/28/14 2151 07/29/14 0600  BP:   125/67 150/62  Pulse:   102 84  Temp:   98.2 F (36.8 C)  97.8 F (36.6 C)  TempSrc:      Resp:   18 18  Height: 6' 0.05" (1.83 m)     Weight:  221 lb 12.5 oz (100.6 kg)    SpO2:   97% 99%   Body mass index is 30.04 kg/(m^2).  General:  WDWN in NAD, confused Gait: Normal HENT: WNL Eyes: Pupils equal Pulmonary: normal non-labored breathing , without Rales, rhonchi,  wheezing Cardiac: RRR, without  Murmurs, rubs or gallops; No carotid bruits Abdomen: soft, NT, no masses Skin: no rashes, ulcers noted;  no Gangrene , positive 4th and 5th digits left foot cellulitis;  Vascular Exam/Pulses:Radial pulses palpable bil. Feet are warm with active range of motion.  Non palpable DP/PT pulses. Doppler signals bilateral DP. Musculoskeletal: no muscle wasting or atrophy; no edema; walked on own Neurologic: A&O X 3; Appropriate Affect ; SENSATION: normal; MOTOR FUNCTION: 5/5 Symmetric Speech is fluent/normal, unable to test CN due to inability to cooperate Psych: confused and demented, unable to test Lymph: no palpable LAD  Significant Diagnostic Studies: CBC Lab Results  Component Value Date   WBC 6.2 07/29/2014   HGB 13.8 07/29/2014   HCT 43.0 07/29/2014   MCV 87.9 07/29/2014   PLT 137* 07/29/2014    BMET    Component Value Date/Time   NA 144 07/29/2014 0655   K 3.5* 07/29/2014 0655   CL 105 07/29/2014 0655   CO2 27 07/29/2014 0655   GLUCOSE 70 07/29/2014 0655   BUN 13 07/29/2014 0655   CREATININE 1.32 07/29/2014 0655   CALCIUM 8.8 07/29/2014 0655   GFRNONAA 47* 07/29/2014 0655   GFRAA 54* 07/29/2014 0655   Estimated Creatinine Clearance: 48.5 ml/min (by C-G formula based on Cr of 1.32).  COAG Lab Results  Component Value Date   INR 1.08 01/09/2010   Radiology: 06/19/14 L ankle: no acute bony abnormality.    06/19/14 L foot: No acute abnormality.    Non-Invasive Vascular Imaging:  Pending ABI  ASSESSMENT/PLAN:  PAD non healing ulcers left foot Cellulitis and edema Started on Vancomycin IV He is not cooperative with the exam, shows obvious  pain response to touch of the left foot.  He does have doppler signals, but we get a better picture of the extent of his vascular disease after ABI's are complete.  Clinton Gallant [facility] 07/29/2014 9:54 AM  Addendum  I have independently interviewed and examined the patient, and I agree with the physician assistant's findings.  Pt probably has a combination of venous insufficiency and PAD.  Pt will not be able to cooperate due his significant dementia, so  angiography is not possible in this patient.  Additionally, I'm not certain he can hold still long enough to get a CTA abd/pelvis with bilateral runoff.  Will wait on ABI before providing any additional recommendations.  Leonides Sake, MD [facility] [place] Office: [phone] Pager: [phone]  07/29/2014, 12:41 PM

## 2014-07-29 NOTE — Progress Notes (Signed)
Subjective: Austin Krause is "alright" this morning.   Last night, he was unable to complete his MRI; the staff attempted to calm him down to get him to lie on his back by playing music and coaching him through it, but he was more comfortable on his side and was unable to stay still enough for the exam.  He has been attempting to get out of bed this morning and urinated on the floor, so a sitter has been placed with him.   The patient's daughter, Austin Krause, will be visiting this afternoon. I have asked his RN to call me when she is here.  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 Another, 0.25 cm ulcer proximal and medial to previously described lesion; also better today. Feet are warm, left foot is very tender to palpation. DPs intact (R stronger than L). 2+ edema in both feet, decreased today and easier to visualize area of demarcation. This area of demarcation is slightly erythematous. Neurologic: Convoluted thought process, sensation and strength intact throughout, unable to move toes, but may have been volitional  Skin: no other lesions   Initial evaluation 07/28/14:     Lab Results: Basic  Metabolic Panel:  Recent Labs Lab 07/28/14 1908 07/29/14 0655  NA 142 144  K 3.5* 3.5*  CL 101 105  CO2 27 27  GLUCOSE 124* 70  BUN 15 13  CREATININE 1.35 1.32  CALCIUM 8.8 8.8  MG 1.9  --    Liver Function Tests:  Recent Labs Lab 07/28/14 1908  AST 16  ALT 10  ALKPHOS 55  BILITOT 0.3  PROT 7.0  ALBUMIN 3.4*   CBC:  Recent Labs Lab 07/28/14 1908 07/29/14 0655  WBC 5.0 6.2  HGB 13.9 13.8  HCT 43.5 43.0  MCV 89.5 87.9  PLT 163 137*   CBG:  Recent Labs Lab 07/28/14 1802 07/28/14 2314 07/29/14 0705 07/29/14 0834  GLUCAP 132* 94 69* 97   Hemoglobin A1C:  Recent Labs Lab 07/28/14 1908  HGBA1C 7.3*   Micro Results: No results found for this or any previous visit (from the past 240 hour(s)). Studies/Results: No results found.  Medications: I have reviewed the patient's current medications. Scheduled Meds: . aspirin  81 mg Oral Daily  . donepezil  10 mg Oral QHS  . heparin  5,000 Units Subcutaneous 3 times per day  . insulin aspart  0-5 Units Subcutaneous QHS  . insulin aspart  0-9 Units Subcutaneous TID WC  . piperacillin-tazobactam (ZOSYN)  IV  3.375 g Intravenous 3 times per  day  . senna-docusate  2 tablet Oral q morning - 10a  . vancomycin  1,500 mg Intravenous Q24H   Continuous Infusions:  PRN Meds:.albuterol, HYDROcodone-acetaminophen Assessment/Plan: Active Problems:   Cellulitis of foot, left  [name] is an 80 yo man with a history of PAD who is presenting with two ulcers on his left 5th toe with surrounding cellulitis that has been treated with outpatient antibiotics. These are likely due to a combination of PAD and venous insufficiency.  Left Foot Ulcers with History of PAD:  - Area of demarcation concerning for cellulitis marked today - ABIs to be completed today (in 2012, they were R 0.41 and L 0.48) - MRI left foot was attempted last night - could not be completed. To be re-attempted with ativan 0.47m today. Radiology advised  that MRI needs to be with contrast; Patient's CrCl is calculated at 56.7. Radiology is comfortable administrating contrast - Continue vancomycin  - Added zosyn  - Norco for pain PRN  - Vascular aware of the patient and will assess - Follow up blood cultures x 2  - ESR was 5,CRP pending   DMII:  - SSI   Hypertension: normotensive in hospital  - Hold BP meds (Benazepril 20 mg daily and amlodipine 4 mg daily)   Hyperlipidemia: currently not on medications   CKD:  - Creatinine currently 1.32; appears to be below his baseline - Creatinine clearance calculated to be 56.7  COPD:  - home albuterol   Dementia (presumed to be Alzheimer's) and Difficulty with Sleep:  - home donepezil  - holding trazadone for now   Diet:  - NPO at midnight; will go back to heart healthy after MRI  DVT Ppx:  - [place] heparin   DNR: patient's daughter, VLorriane Krause at bedside; DNR status was discussed and confirmed by her  Dispo: Disposition is deferred at this time, awaiting improvement of current medical problems.  Anticipated discharge in approximately 3 day(s).   The patient does have a current PCP ([name] MD) and [facility].  The patient does have transportation limitations that hinder transportation to clinic appointments.  .Services Needed at time of discharge: Y = Yes, Blank = No PT:   OT:   RN:   Equipment:   Other:     LOS: 1 day   JDrucilla Schmidt MD 07/29/2014, 9:35 AM

## 2014-07-30 DIAGNOSIS — E785 Hyperlipidemia, unspecified: Secondary | ICD-10-CM

## 2014-07-30 DIAGNOSIS — I129 Hypertensive chronic kidney disease with stage 1 through stage 4 chronic kidney disease, or unspecified chronic kidney disease: Secondary | ICD-10-CM

## 2014-07-30 DIAGNOSIS — E119 Type 2 diabetes mellitus without complications: Secondary | ICD-10-CM

## 2014-07-30 DIAGNOSIS — F0390 Unspecified dementia without behavioral disturbance: Secondary | ICD-10-CM

## 2014-07-30 DIAGNOSIS — L97509 Non-pressure chronic ulcer of other part of unspecified foot with unspecified severity: Secondary | ICD-10-CM

## 2014-07-30 DIAGNOSIS — N189 Chronic kidney disease, unspecified: Secondary | ICD-10-CM

## 2014-07-30 DIAGNOSIS — J449 Chronic obstructive pulmonary disease, unspecified: Secondary | ICD-10-CM

## 2014-07-30 LAB — GLUCOSE, CAPILLARY
Glucose-Capillary: 82 mg/dL (ref 70–99)
Glucose-Capillary: 87 mg/dL (ref 70–99)
Glucose-Capillary: 99 mg/dL (ref 70–99)
Glucose-Capillary: 100 mg/dL — ABNORMAL HIGH (ref 70–99)

## 2014-07-30 MED ORDER — AMLODIPINE BESYLATE 5 MG PO TABS
5.0000 mg | ORAL_TABLET | Freq: Every day | ORAL | Status: DC
  Administered 2014-07-30 – 2014-07-31 (×2): 5 mg via ORAL
  Filled 2014-07-30 (×3): qty 1

## 2014-07-30 MED ORDER — LORAZEPAM 0.5 MG PO TABS
0.5000 mg | ORAL_TABLET | Freq: Every day | ORAL | Status: DC
  Administered 2014-07-30: 0.5 mg via ORAL
  Filled 2014-07-30 (×2): qty 1

## 2014-07-30 MED ORDER — TRAZODONE HCL 50 MG PO TABS
50.0000 mg | ORAL_TABLET | Freq: Every day | ORAL | Status: DC
  Administered 2014-07-30: 50 mg via ORAL
  Filled 2014-07-30 (×2): qty 1

## 2014-07-30 NOTE — Progress Notes (Addendum)
   [facility] [place]  Subjective  - Alert and pleasant.  Dementia can't follow commands.   Objective 147/60 97 97.3 F (36.3 C) (Oral) 18 97%  Intake/Output Summary (Last 24 hours) at 07/30/14 0824 Last data filed at 07/30/14 0747  Gross per 24 hour  Intake   1200 ml  Output   1200 ml  Net      0 ml   ABI'S : 07/29/2014 Right >1 Left >1 Vascular Exam: Doppler signals bilateral DP. positive 4th and 5th digits left foot cellulitis; Painful to touch     Assessment/Planning: PAD Cellulitis and edema left foot ABI report likely falsely elevated due to calcified vessels He is easily agitated and can't follow commands due to his dementia.  We recommend local wound care and continued IV antibiotics.  If neccessary we may need to take him to the OR in the future for a sedated Angiogram with bil LE run offs to better visualize his vascular compromise.       Clinton Gallant [facility] 07/30/2014 8:24 AM --  Laboratory Lab Results:  Recent Labs  07/28/14 1908 07/29/14 0655  WBC 5.0 6.2  HGB 13.9 13.8  HCT 43.5 43.0  PLT 163 137*   BMET  Recent Labs  07/28/14 1908 07/29/14 0655  NA 142 144  K 3.5* 3.5*  CL 101 105  CO2 27 27  GLUCOSE 124* 70  BUN 15 13  CREATININE 1.35 1.32  CALCIUM 8.8 8.8    COAG Lab Results  Component Value Date   INR 1.08 01/09/2010   No results found for this basename: PTT   Addendum  I have independently interviewed and examined the patient, and I agree with the physician assistant's findings.  Exam better today than yesterday.  ABI waveforms consistent with severe PAD in both legs.  In this demented 80 year male, I don't recommended aggressive intervention.  Again, I don't think he could be imaged in the angiography suite so endovascular intervention would not be an option without deep sedation/general anesthesia in the hybrid OR setting.  I'm not convinced that the risk-benefit analysis justifies proceeding  with such.  I would get Dr. Audrie Lia opinion about management of this diabetic and arterial insufficiency foot ulcer.  If he does not respond to wound care with Dr. Lajoyce Corners in 2-3 month trial, I would reconsider proceeding with an endovascular intervention the hybrid OR.  Available as needed.  Leonides Sake, MD [facility] [place] Office: [phone] Pager: [phone]  07/30/2014, 1:48 PM

## 2014-07-30 NOTE — Progress Notes (Signed)
Internal Medicine On-Call Attending  Date: 07/30/2014  Patient name: Austin Krause Medical record number: [id number] Date of birth: [birth year] Age: 80 y.o. Gender: male  I saw and evaluated the patient. I discussed patient and reviewed the resident's note by Dr. Sherrine Maples, and I agree with the resident's findings and plans as documented in her note.  Dr. Heide Spark will take over as attending physician tomorrow 07/31/2014.

## 2014-07-30 NOTE — Progress Notes (Signed)
Subjective: Pt doing well this morning. His pain seems to be minimal. ABIs performed yesterday with difficulty due to the pt's dementia and difficulty following commands.  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 Another, 0.25 cm ulcer proximal and medial to previously described lesion; also better today. Left foot is edematous with erythema, which is improved significantly from admission.  Neurologic: Pleasantly demented and unable to follow commands Skin: no other lesions   Initial evaluation 07/28/14:     Lab Results: Basic Metabolic Panel:  Recent Labs Lab 07/28/14 1908 07/29/14 0655  NA 142 144  K 3.5* 3.5*  CL 101 105  CO2 27 27  GLUCOSE 124* 70  BUN 15 13  CREATININE 1.35 1.32  CALCIUM 8.8 8.8  MG 1.9  --    Liver Function Tests:  Recent Labs Lab 07/28/14 1908  AST 16  ALT 10  ALKPHOS 55  BILITOT 0.3  PROT 7.0  ALBUMIN 3.4*   CBC:  Recent Labs Lab 07/28/14 1908 07/29/14 0655  WBC 5.0 6.2  HGB 13.9 13.8  HCT 43.5 43.0  MCV 89.5 87.9  PLT 163 137*   CBG:  Recent Labs Lab 07/29/14 0705 07/29/14 0834 07/29/14 1137 07/29/14 1638 07/29/14 2207  07/30/14 0658  GLUCAP 69* 97 70 109* 86 82   Hemoglobin A1C:  Recent Labs Lab 07/28/14 1908  HGBA1C 7.3*   Micro Results: No results found for this or any previous visit (from the past 240 hour(s)). Studies/Results: No results found.  Medications: I have reviewed the patient's current medications. Scheduled Meds: . aspirin  81 mg Oral Daily  . donepezil  10 mg Oral QHS  . heparin  5,000 Units Subcutaneous 3 times per day  . insulin aspart  0-5 Units Subcutaneous QHS  . insulin aspart  0-9 Units Subcutaneous TID WC  . LORazepam  0.25 mg Intravenous Once  . piperacillin-tazobactam (ZOSYN)  IV  3.375 g Intravenous 3 times per day  . senna-docusate  2 tablet Oral q morning - 10a  . vancomycin  1,500 mg Intravenous Q24H   Continuous Infusions:  PRN Meds:.albuterol, HYDROcodone-acetaminophen Assessment/Plan: Active Problems:   Cellulitis of foot, left  Austin Krause is an 80 yo man with a history of PAD who is presenting with two ulcers on his left 5th toe with surrounding cellulitis that has failed outpatient antibiotics and are likely due to his PAD and venous insufficiency.  Left Foot Ulcers with History of PAD: Significant edema and erythema with ulcerations at  4-5th toes. Vanc and Zosyn started and the edema and erythema continue to improve. ABIs >1, but likely falsely elevated 2/2 calcified vessels. ABIs in 2012, they were R 0.41 and L 0.48. Vascular Surgery is following and currently recommends to continue abx and wound care. Will continue IV abx for one more day and consider transitioning to po tomorrow.  - Continue Vancomycin and Zosyn - Home Norco for pain PRN  - Follow up blood cultures x 2  - F/u w/ Vascular Surgery, appreciate recommendations   DMII: Stable. CBGs controlled on SSI, although he has not required insulin since 9/11. - SSI   Hypertension: Normotensive in hospital initially so held home meds or Benazepril 20 mg daily and amlodipine 5 mg daily. BP  beginning to increase. Restarting CCB. - Restart Amlodipine  daily  CKD: Creatinine 1.32; appears to be below his baseline. Creatinine clearance calculated to be 56.7 - BMP in AM  COPD: Stable. - home albuterol   Dementia (presumed to be Alzheimer's) and Difficulty with Sleep:  - Sitter in the room - Continuing home donepezil  - Holding trazadone for now   Diet:  - Heart healthy diet  DVT PPx:  - [place] heparin   DNR: DNR. Status confirmed by daughter.   Dispo: Disposition is deferred at this time, awaiting improvement of current medical problems.  Anticipated discharge in approximately 1-2day(s).   The patient does have a current PCP Austin Bastos, MD) and does not need an [facility] hospital follow-up appointment after discharge.  The patient does have transportation limitations that hinder transportation to clinic appointments.  .Services Needed at time of discharge: Y = Yes, Blank = No PT:   OT:   RN:   Equipment:   Other:     LOS: 2 days   Genelle Gather, MD 07/30/2014, 9:12 AM

## 2014-07-31 DIAGNOSIS — L97509 Non-pressure chronic ulcer of other part of unspecified foot with unspecified severity: Secondary | ICD-10-CM

## 2014-07-31 DIAGNOSIS — L02619 Cutaneous abscess of unspecified foot: Principal | ICD-10-CM

## 2014-07-31 DIAGNOSIS — L03039 Cellulitis of unspecified toe: Principal | ICD-10-CM

## 2014-07-31 DIAGNOSIS — I739 Peripheral vascular disease, unspecified: Secondary | ICD-10-CM

## 2014-07-31 DIAGNOSIS — E119 Type 2 diabetes mellitus without complications: Secondary | ICD-10-CM

## 2014-07-31 LAB — CBC
HEMATOCRIT: 44 % (ref 39.0–52.0)
Hemoglobin: 14.5 g/dL (ref 13.0–17.0)
MCH: 28.8 pg (ref 26.0–34.0)
MCHC: 33 g/dL (ref 30.0–36.0)
MCV: 87.3 fL (ref 78.0–100.0)
Platelets: 145 10*3/uL — ABNORMAL LOW (ref 150–400)
RBC: 5.04 MIL/uL (ref 4.22–5.81)
RDW: 13.8 % (ref 11.5–15.5)
WBC: 4.7 10*3/uL (ref 4.0–10.5)

## 2014-07-31 LAB — BASIC METABOLIC PANEL
ANION GAP: 15 (ref 5–15)
BUN: 12 mg/dL (ref 6–23)
CALCIUM: 9.1 mg/dL (ref 8.4–10.5)
CO2: 25 mEq/L (ref 19–32)
Chloride: 102 mEq/L (ref 96–112)
Creatinine, Ser: 1.4 mg/dL — ABNORMAL HIGH (ref 0.50–1.35)
GFR calc Af Amer: 51 mL/min — ABNORMAL LOW (ref >90)
GFR, EST NON AFRICAN AMERICAN: 44 mL/min — AB (ref >90)
Glucose, Bld: 96 mg/dL (ref 70–99)
POTASSIUM: 4.4 meq/L (ref 3.7–5.3)
SODIUM: 142 meq/L (ref 137–147)

## 2014-07-31 LAB — GLUCOSE, CAPILLARY
GLUCOSE-CAPILLARY: 100 mg/dL — AB (ref 70–99)
GLUCOSE-CAPILLARY: 85 mg/dL (ref 70–99)
Glucose-Capillary: 99 mg/dL (ref 70–99)

## 2014-07-31 MED ORDER — LORAZEPAM 2 MG/ML IJ SOLN
2.0000 mg | Freq: Once | INTRAMUSCULAR | Status: AC
  Administered 2014-07-31: 2 mg via INTRAMUSCULAR

## 2014-07-31 MED ORDER — HALOPERIDOL 1 MG PO TABS
1.0000 mg | ORAL_TABLET | Freq: Once | ORAL | Status: AC
  Filled 2014-07-31: qty 1

## 2014-07-31 MED ORDER — LORAZEPAM 0.5 MG PO TABS: 0.5000 mg | ORAL_TABLET | Freq: Once | ORAL | Status: DC

## 2014-07-31 MED ORDER — AMOXICILLIN-POT CLAVULANATE 500-125 MG PO TABS: 1.0000 | ORAL_TABLET | Freq: Two times a day (BID) | ORAL | Status: AC

## 2014-07-31 MED ORDER — HALOPERIDOL LACTATE 5 MG/ML IJ SOLN: 1.0000 mg | Freq: Once | INTRAMUSCULAR | Status: DC

## 2014-07-31 MED ORDER — HALOPERIDOL 1 MG PO TABS: 1.0000 mg | ORAL_TABLET | Freq: Once | ORAL | Status: DC

## 2014-07-31 MED ORDER — HALOPERIDOL LACTATE 5 MG/ML IJ SOLN
INTRAMUSCULAR | Status: AC
  Administered 2014-07-31: 1 mg
  Filled 2014-07-31: qty 1

## 2014-07-31 MED ORDER — HALOPERIDOL LACTATE 5 MG/ML IJ SOLN
1.0000 mg | Freq: Once | INTRAMUSCULAR | Status: AC
  Administered 2014-07-31: 1 mg via INTRAMUSCULAR

## 2014-07-31 NOTE — Progress Notes (Signed)
 Subjective: Austin Krause keeps repeating "what you going to do" this morning.  Last night, due to a hospital shortage, he was without a sitter. He had an episode of severe agitation that required four security guards, haldol 1 mg x2 and ativan 2 mg. Physical restraints were then put in place for 2 hours.  Objective: Vital signs in last 24 hours: Filed Vitals:   07/30/14 1500 07/30/14 1502 07/30/14 2015 07/31/14 0944  BP: 91/52 119/65 150/77 151/114  Pulse: 95  77 104  Temp: 98.4 F (36.9 C)  97.8 F (36.6 C) 97.7 F (36.5 C)  TempSrc: Oral  Oral Axillary  Resp: 16  16 20  Height:      Weight:      SpO2: 100%  100% 98%   Weight change:   Intake/Output Summary (Last 24 hours) at 07/31/14 1058 Last data filed at 07/31/14 0900  Gross per 24 hour  Intake    620 ml  Output    800 ml  Net   -180 ml    Physical Exam: Appearance: lying in bed asleep, in NAD, slightly agitated during exam HEENT: AT/[place], 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 Another, 0.25 cm ulcer proximal and medial to previously described lesion; also better today. Edema has subsided. Feet are warm, left foot is slightly tender to palpation. DPs intact (R stronger than L). See images below. Neurologic: Convoluted thought process, sensation and strength intact throughout Skin: no other lesions   Initial evaluations (2 photos) 07/28/14:     Day of discharge (07/31/14):    Lab Results: Basic Metabolic Panel:  Recent Labs Lab 07/28/14 1908 07/29/14 0655  NA 142 144  K 3.5* 3.5*  CL 101 105  CO2 27 27  GLUCOSE 124* 70  BUN 15 13  CREATININE 1.35 1.32  CALCIUM 8.8 8.8  MG 1.9  --    Liver Function  Tests:  Recent Labs Lab 07/28/14 1908  AST 16  ALT 10  ALKPHOS 55  BILITOT 0.3  PROT 7.0  ALBUMIN 3.4*   CBC:  Recent Labs Lab 07/28/14 1908 07/29/14 0655  WBC 5.0 6.2  HGB 13.9 13.8  HCT 43.5 43.0  MCV 89.5 87.9  PLT 163 137*   CBG:  Recent Labs Lab 07/29/14 2207 07/30/14 0658 07/30/14 1140 07/30/14 1614 07/30/14 2122 07/31/14 0833  GLUCAP 86 82 87 99 100* 100*   Hemoglobin A1C:  Recent Labs Lab 07/28/14 1908  HGBA1C 7.3*   Micro Results: Recent Results (from the past 240 hour(s))  CULTURE, BLOOD (ROUTINE X 2)     Status: None   Collection Time    07/28/14  7:08 PM      Result Value Ref Range Status   Specimen Description BLOOD RIGHT ARM   Final   Special Requests BOTTLES DRAWN AEROBIC AND ANAEROBIC 5CC   Final   Culture  Setup Time     Final   Value: 07/29/2014 02:07     Performed at [facility]   Culture     Final   Value:        BLOOD CULTURE RECEIVED NO GROWTH TO DATE CULTURE WILL BE HELD FOR 5 DAYS BEFORE ISSUING A FINAL NEGATIVE REPORT       Performed at [facility]   Report Status PENDING   Incomplete   Studies/Results: No results found.  Medications: I have reviewed the patient's current medications. Scheduled Meds: . amLODipine  5 mg Oral Daily  . aspirin  81 mg Oral Daily  . donepezil  10 mg Oral QHS  . haloperidol  1 mg Oral Once   Or  . haloperidol lactate  1 mg Intramuscular Once  . heparin  5,000 Units Subcutaneous 3 times per day  . insulin aspart  0-5 Units Subcutaneous QHS  . insulin aspart  0-9 Units Subcutaneous TID WC  . LORazepam  0.25 mg Intravenous Once  . LORazepam  0.5 mg Oral QHS  . LORazepam  0.5 mg Oral Once  . piperacillin-tazobactam (ZOSYN)  IV  3.375 g Intravenous 3 times per day  . senna-docusate  2 tablet Oral q morning - 10a  . traZODone  50 mg Oral QHS  . vancomycin  1,500 mg Intravenous Q24H   Continuous Infusions:  PRN Meds:.albuterol,  HYDROcodone-acetaminophen Assessment/Plan: Active Problems:   Cellulitis of foot, left  [name] is an 80 yo man with a history of PAD who is presenting with two ulcers on his left 5th toe with surrounding cellulitis that has been treated with outpatient antibiotics. These are likely foot ulcers that are both diabetic and due to chronic arterial insufficiency.  Left Foot Ulcers: ABIs >1 in both lower extremities (likely false due to calcified vessels); ABIs in 2012 were R 0.41 and L 0.48. Vascular surgery consulted and recommend continued antibiotics, wound care and orthopaedics follow-up. ESR and white count have not been elevated. Afebrile. Ulcer appears to be much improved and area of demarcation concerning for cellulitis was marked on Saturday and has decreased markedly - ABIs >1 in both  (in 2012, they were R 0.41 and L 0.48) - MRI left foot was unable to be completed due to patient's inability to follow directions, agitation, inability to lie on back  - Has been on vancomycin and zosyn; transition to augmentin today - Norco for pain PRN - Blood cultures x 2 NGTD  DMII: Stable. CBGs controlled on SSI, but patient has not required insulin since 9/11.  - SSI   Hypertension: normotensive in hospital initially, so held home BP meds. Then BP started to rise, so home amlodipine was reinitiated. - Restarted amlodipine 5 mg daily but holding Benazepril 20 mg daily   CKD: Creatinine is 1.32; appears to be below his baseline. Creatinine clearance calculated to be 56.7.  COPD: Stable.  - home albuterol   Dementia (presumed to be Alzheimer's) and Difficulty with Sleep:  - home donepezil and trazodone with sitter in room  Diet:  - heart healthy   DVT Ppx:  - [place] heparin   DNR: patient's daughter, Austin Krause, confirmed DNR status during this admission  Dispo: Disposition is deferred at this time, awaiting improvement of current medical problems.  Anticipated discharge in approximately 0  day(s).   The patient does have a current PCP [name], MD) and does not need an [facility] hospital follow-up appointment after discharge.  The patient does have transportation limitations that hinder transportation to clinic appointments.  .Services Needed at time of discharge: Y = Yes, Blank = No PT:   OT:   RN:   Equipment:   Other:     LOS: 3 days   Drucilla Schmidt, MD 07/31/2014, 10:58 AM

## 2014-07-31 NOTE — Progress Notes (Signed)
Called for IV restart.  Pt. In 4 pt restraints.  Yelling, spitting. Unsuccessful attempt x1.  Staff RN aware.

## 2014-07-31 NOTE — Progress Notes (Signed)
Patient became combative, trying to get out bed.  When trying to convenience patient to not get out of bed he began kicking.  Deniece Portela came to the room to try and help along with the NT's.  When he continued to become more combative security was called.  The doctor was notified of the situation, an order of IM Haldol  was given, then another order for Haldol IM  was given again without any change.  The doctor came to patient's room, he ordered 1 mL of Ativan IM, restraints were then ordered with a 2 hour expiration.  Patient remains combative at this time.

## 2014-07-31 NOTE — Progress Notes (Signed)
IMTS Night Float Progress note  Called for severe agitation and violent behavior. Security called. Upon arrival, patient requiring manual restraint from security x 4 extremities. Patient has history of dementia and has required a sitter through his admission.    - Haldol 1 mg x 2  - Ativan 2 mg x 1  - Waist and four extremity physical restraints as patient is a danger to himself and others.  4:55 AM  - After 30 minutes, patient has become less violent and calmer. Physical restraints in place and set to expire in two hours.

## 2014-07-31 NOTE — Discharge Summary (Signed)
Name: Austin Krause MRN: [id number] DOB: 1925/07/05 80 y.o. PCP: [name], MD  Date of Admission: 07/28/2014  4:11 PM Date of Discharge: 07/31/2014 Attending Physician: Aldine Contes, MD  Discharge Diagnosis: Active Problems:   Cellulitis of foot, left  Discharge Medications:   Medication List         albuterol (2.5 MG/3ML) 0.083% nebulizer solution  Commonly known as:  PROVENTIL  Take 2.5 mg by nebulization every 6 (six) hours as needed for wheezing or shortness of breath.     amLODipine 5 MG tablet  Commonly known as:  NORVASC  Take 5 mg by mouth daily.     amoxicillin-clavulanate 500-125 MG per tablet  Commonly known as:  AUGMENTIN  Take 1 tablet (500 mg total) by mouth 2 (two) times daily.     aspirin 81 MG chewable tablet  Chew 81 mg by mouth daily.     benazepril 20 MG tablet  Commonly known as:  LOTENSIN  Take 20 mg by mouth daily.     cholecalciferol 1000 UNITS tablet  Commonly known as:  VITAMIN D  Take 1,000 Units by mouth daily.     clotrimazole 1 % cream  Commonly known as:  LOTRIMIN  Apply 1 application topically at bedtime.     colchicine 0.6 MG tablet  Take 0.6 mg by mouth daily.     donepezil 10 MG tablet  Commonly known as:  ARICEPT  Take 10 mg by mouth daily.     HYDROcodone-acetaminophen 7.5-325 MG per tablet  Commonly known as:  NORCO  Take 1 tablet by mouth 2 (two) times daily.     LORazepam 0.5 MG tablet  Commonly known as:  ATIVAN  Take 0.5 mg by mouth at bedtime.     predniSONE 10 MG tablet  Commonly known as:  DELTASONE  Take 10 mg by mouth daily.     senna-docusate 8.6-50 MG per tablet  Commonly known as:  Senokot-S  Take 1-2 tablets by mouth 2 (two) times daily. Take 1 tablet every morning and 2 tablets every afternoon     traZODone 50 MG tablet  Commonly known as:  DESYREL  Take 50 mg by mouth at bedtime.        Disposition and follow-up:   AustinAustin Krause was discharged from [facility] in Stable condition.  At the hospital follow up visit please address:  1.  [name] was hospitalized for treatment of his left toe ulcer. It is unclear if it is a diabetic ulcer or due to arterial insufficiency. He was given vancomycin and zosyn in the hospital and sent home on a 10 day course of augmentin.  His ABIs while here were >1 on each side, suggestive of calcification. Vascular surgery assessed him, found him to be a poor surgical candidate, and suggested follow-up with orthopaedic surgery for a 2-3 month trial of wound care. If this is unsuccessful, vascular surgery suggests reconsidering proceeding with an endovascular intervention.   Blood cultures are negative to date.  2.  Labs / imaging needed at time of follow-up: CBC  3.  Pending labs/ test needing follow-up: Blood cultures from 07/28/14 (with NGTD on 07/31/14)  Follow-up Appointments:     Follow-up Information   Follow up with Newt Minion, MD On 08/10/2014. (8:00 AM)    Specialty:  Orthopedic Surgery   Contact information:   [place] [id number] [phone]       Discharge Instructions: Your toe ulcer  may be due to your diabetes or due to your blood flow to your toes. You will follow up with the orthopaedic surgeon for wound care and possible surgery. Take the full course of augmentin antibiotics starting today.   Consultations:  vascular surgery  Procedures Performed:  No results found.  Admission HPI:  [name] is an 80 yo man who is a [facility] patient with HTN, DM, PAD, hyperlipidemia, smoking history (now quit), COPD, CKD and dementia who is here for evaluation of left foot ulcers on the 4th and 5th toes. He received outpatient treatment with 20 days of dual antibiotics (ciprofloxacin and doxycycline), but his lesions persisted. He is unable to provide a history of his pain or the lesions.   Left foot and ankle xrays in 06/2014 showed no evidence of fracture or osteomyelitis,  only positive for vascular calcifications and severe pes planus.  Admission Physical Exam: Blood pressure 114/69, pulse 92, temperature 98.2 F (36.8 C), temperature source Oral, resp. rate 16, SpO2 100.00%.  Exam limited by patient's pain / moaning / screams  Appearance: sitting up in chair in NAD  HEENT: AT/[place], mild conjunctival erythema OU, hearing intact, no lymphadenopathy  Heart: RRR, normal S1S2  Lungs: CTAB  Abdomen: BS+, nontender  Extremities: hyperpigmentation c/w chronic venous stasis b/l, very dry skin, 1 cm wide circular ulcer that is erythematous with white clearing in center on lateral part of 5th toe. Another, 0.25 cm ulcer proximal and medial to previously described lesion. Feet are warm, left foot is very tender to palpation. DPs intact (R stronger than L). 3+ edema in both feet, extends to mid-shin.  Neurologic: Convoluted thought process, sensation and strength intact throughout, unable to move toes, but may have been volitional  Skin: no other lesions  Initial evaluation 07/28/14:   Initial evaluation 07/28/14:     Hospital Course by problem list: Active Problems:   Cellulitis of foot, left  [name] is an 80 yo man with a history of DMII and PAD who is presenting with two ulcers on his left 5th toe with surrounding cellulitis that has been treated with outpatient antibiotics. It is unclear whether these are diabetic foot ulcers or whether they are due to his chronic arterial insufficiency.   Left Foot Ulcers: The patient's ABIs on this admission were >1 in both lower extremities (likely false due to calcified vessels); ABIs in 2012 were R 0.41 and L 0.48. Vascular surgery was consulted and recommend continued antibiotics, wound care and orthopaedics follow-up. ESR and white count were not elevated and the patient remained afebrile. His blood cultures were negative for 3 days. The ulcers appeared to be much improved and the area of demarcation concerning for cellulitis  decreased markedly on vancomycin and zosyn (see photo below). Patient was unable to complete an MRI due to difficulty following instructions and inability to stay still. The patient was sent home on augmentin.  The patient's DMII, hypertension, CKD, COPD and dementia were stable and managed on home regimen.  DNR: patient's daughter, [name], confirmed DNR status during this admission  Day of discharge (07/31/14):    Discharge Vitals:   BP 138/61  Pulse 63  Temp(Src) 98.1 F (36.7 C) (Axillary)  Resp 20  Ht 6' 0.05" (1.83 m)  Wt 221 lb 12.5 oz (100.6 kg)  BMI 30.04 kg/m2  SpO2 100%   Signed: Drucilla Schmidt, MD 07/31/2014, 1:06 PM    Services Ordered on Discharge: none Equipment Ordered on Discharge: none

## 2014-07-31 NOTE — Discharge Summary (Signed)
INTERNAL MEDICINE ATTENDING DISCHARGE COSIGN   I evaluated the patient on the day of discharge and discussed the discharge plan with my resident team. I agree with the discharge documentation and disposition.   Alyda Megna 07/31/2014, 4:32 PM

## 2014-07-31 NOTE — Progress Notes (Signed)
Pt was prescribed Haldol 1 mg, per doctor's order.  I gave this to the patient, then the doctor came to the floor and told me to give another 1 mg, I with drew it from the same bottle.  I wasted 3 mg this morning.

## 2014-07-31 NOTE — Discharge Instructions (Signed)
Your toe ulcer may be due to your diabetes or due to your blood flow to your toes. You will follow up with the orthopaedic surgeon for wound care and possible surgery. Take the full course of augmentin antibiotics starting today.

## 2014-07-31 NOTE — Progress Notes (Signed)
CARE MANAGEMENT NOTE 07/31/2014  Patient:  Austin Krause,Austin Krause   Account Number:  [id number]  Date Initiated:  07/31/2014  Documentation initiated by:  [facility]  Subjective/Objective Assessment:   admitted with ulcers to lt foot     Action/Plan:   Patient goes to adult daycare at [facility]   Anticipated DC Date:  07/31/2014   Anticipated DC Plan:  HOME/SELF CARE      DC Planning Services  CM consult      Choice offered to / List presented to:             Status of service:  Completed, signed off Medicare Important Message given?  YES (If response is "NO", the following Medicare IM given date fields will be blank) Date Medicare IM given:  07/31/2014 Medicare IM given by:  [facility] Date Additional Medicare IM given:   Additional Medicare IM given by:    Discharge Disposition:  HOME/SELF CARE  Per UR Regulation:    If discussed at Long Length of Stay Meetings, dates discussed:    Comments:  07/31/14 Call [facility] [phone], spoke with patient's SW Irving Burton and informed her that patient is being discharged to home today, no HHC or equipment ordered. She will arrange for transportation home. Jacquelynn Cree RN, BSN, CCM

## 2014-07-31 NOTE — Progress Notes (Signed)
Pt seen and examined with Dr. Leatha Gilding. Please refer to resident note for details  Pt confused on talking to him . Overnight had an episode of agitation requiring Haldol and ativan  Exam: Cardio: RRR, normal heart sounds Lungs: CTA b/l Abd: soft, non tender, BS + Ext: L 5th toe ulcer approx 1 cm with mild erythema, mild TTP Gen: awake, confused  Assessment and Plan: 80 y/o male with L 5th toe ulcer  L foot ulcer: - d/c IV abx. Will start PO augmentin - MRI unable to be completed secondary to non compliance - blood cx NGTD. Will f/u - No fevers, no leukocytosis, no purulent discharge. Will d/c home on PO abx - No intervention per vascular sx at this time. ABI noted - Will refer to wound care as outpatient and refer to [facility] at vascular request

## 2014-08-04 LAB — CULTURE, BLOOD (ROUTINE X 2): Culture: NO GROWTH

## 2014-09-17 DEATH — deceased
